# Patient Record
Sex: Male | Born: 1935
Health system: Southern US, Community
[De-identification: ages and names within clinical notes are randomized; demographics above are authoritative.]

## PROBLEM LIST (undated history)

## (undated) DIAGNOSIS — N179 Acute kidney failure, unspecified: Secondary | ICD-10-CM

## (undated) DIAGNOSIS — I10 Essential (primary) hypertension: Secondary | ICD-10-CM

## (undated) DIAGNOSIS — D62 Acute posthemorrhagic anemia: Secondary | ICD-10-CM

## (undated) DIAGNOSIS — C801 Malignant (primary) neoplasm, unspecified: Secondary | ICD-10-CM

## (undated) HISTORY — PX: KIDNEY STONE SURGERY: SHX686

## (undated) HISTORY — PX: EYE SURGERY: SHX253

## (undated) HISTORY — PX: CHOLECYSTECTOMY: SHX55

## (undated) HISTORY — PX: TONSILLECTOMY: SUR1361

---

## 2000-12-07 ENCOUNTER — Emergency Department (HOSPITAL_COMMUNITY): Admission: EM | Admit: 2000-12-07 | Discharge: 2000-12-07 | Payer: Self-pay | Admitting: Emergency Medicine

## 2001-02-25 ENCOUNTER — Other Ambulatory Visit: Admission: RE | Admit: 2001-02-25 | Discharge: 2001-02-25 | Payer: Self-pay | Admitting: Dermatology

## 2004-02-12 ENCOUNTER — Emergency Department (HOSPITAL_COMMUNITY): Admission: EM | Admit: 2004-02-12 | Discharge: 2004-02-12 | Payer: Self-pay | Admitting: Emergency Medicine

## 2004-03-03 ENCOUNTER — Ambulatory Visit (HOSPITAL_COMMUNITY): Admission: RE | Admit: 2004-03-03 | Discharge: 2004-03-03 | Payer: Self-pay | Admitting: Internal Medicine

## 2004-11-17 ENCOUNTER — Emergency Department (HOSPITAL_COMMUNITY): Admission: EM | Admit: 2004-11-17 | Discharge: 2004-11-17 | Payer: Self-pay | Admitting: Emergency Medicine

## 2004-12-14 ENCOUNTER — Inpatient Hospital Stay (HOSPITAL_COMMUNITY): Admission: RE | Admit: 2004-12-14 | Discharge: 2004-12-16 | Payer: Self-pay | Admitting: Urology

## 2005-01-18 ENCOUNTER — Inpatient Hospital Stay (HOSPITAL_COMMUNITY): Admission: RE | Admit: 2005-01-18 | Discharge: 2005-01-20 | Payer: Self-pay | Admitting: Urology

## 2005-05-17 ENCOUNTER — Encounter: Admission: RE | Admit: 2005-05-17 | Discharge: 2005-05-17 | Payer: Self-pay | Admitting: Urology

## 2005-05-19 ENCOUNTER — Ambulatory Visit (HOSPITAL_BASED_OUTPATIENT_CLINIC_OR_DEPARTMENT_OTHER): Admission: RE | Admit: 2005-05-19 | Discharge: 2005-05-19 | Payer: Self-pay | Admitting: Urology

## 2005-05-19 ENCOUNTER — Ambulatory Visit (HOSPITAL_COMMUNITY): Admission: RE | Admit: 2005-05-19 | Discharge: 2005-05-19 | Payer: Self-pay | Admitting: Urology

## 2005-08-31 ENCOUNTER — Ambulatory Visit (HOSPITAL_BASED_OUTPATIENT_CLINIC_OR_DEPARTMENT_OTHER): Admission: RE | Admit: 2005-08-31 | Discharge: 2005-08-31 | Payer: Self-pay | Admitting: Urology

## 2006-06-06 ENCOUNTER — Ambulatory Visit: Payer: Self-pay | Admitting: Internal Medicine

## 2006-06-06 ENCOUNTER — Ambulatory Visit (HOSPITAL_COMMUNITY): Admission: RE | Admit: 2006-06-06 | Discharge: 2006-06-06 | Payer: Self-pay | Admitting: Internal Medicine

## 2011-07-21 DIAGNOSIS — I10 Essential (primary) hypertension: Secondary | ICD-10-CM | POA: Diagnosis not present

## 2011-07-21 DIAGNOSIS — Z79899 Other long term (current) drug therapy: Secondary | ICD-10-CM | POA: Diagnosis not present

## 2011-07-28 DIAGNOSIS — N390 Urinary tract infection, site not specified: Secondary | ICD-10-CM | POA: Diagnosis not present

## 2011-07-28 DIAGNOSIS — I10 Essential (primary) hypertension: Secondary | ICD-10-CM | POA: Diagnosis not present

## 2011-10-06 DIAGNOSIS — N390 Urinary tract infection, site not specified: Secondary | ICD-10-CM | POA: Diagnosis not present

## 2012-03-22 DIAGNOSIS — Z79899 Other long term (current) drug therapy: Secondary | ICD-10-CM | POA: Diagnosis not present

## 2012-03-28 DIAGNOSIS — K5289 Other specified noninfective gastroenteritis and colitis: Secondary | ICD-10-CM | POA: Diagnosis not present

## 2012-03-28 DIAGNOSIS — I10 Essential (primary) hypertension: Secondary | ICD-10-CM | POA: Diagnosis not present

## 2012-07-18 DIAGNOSIS — H6120 Impacted cerumen, unspecified ear: Secondary | ICD-10-CM | POA: Diagnosis not present

## 2012-07-18 DIAGNOSIS — H70009 Acute mastoiditis without complications, unspecified ear: Secondary | ICD-10-CM | POA: Diagnosis not present

## 2012-07-25 DIAGNOSIS — H6120 Impacted cerumen, unspecified ear: Secondary | ICD-10-CM | POA: Diagnosis not present

## 2012-10-03 DIAGNOSIS — N390 Urinary tract infection, site not specified: Secondary | ICD-10-CM | POA: Diagnosis not present

## 2012-10-03 DIAGNOSIS — C679 Malignant neoplasm of bladder, unspecified: Secondary | ICD-10-CM | POA: Diagnosis not present

## 2012-10-03 DIAGNOSIS — Z792 Long term (current) use of antibiotics: Secondary | ICD-10-CM | POA: Diagnosis not present

## 2013-09-29 DIAGNOSIS — H6120 Impacted cerumen, unspecified ear: Secondary | ICD-10-CM | POA: Diagnosis not present

## 2013-11-28 DIAGNOSIS — R82998 Other abnormal findings in urine: Secondary | ICD-10-CM | POA: Diagnosis not present

## 2013-11-28 DIAGNOSIS — N390 Urinary tract infection, site not specified: Secondary | ICD-10-CM | POA: Diagnosis not present

## 2013-11-28 DIAGNOSIS — B958 Unspecified staphylococcus as the cause of diseases classified elsewhere: Secondary | ICD-10-CM | POA: Diagnosis not present

## 2013-11-28 DIAGNOSIS — C672 Malignant neoplasm of lateral wall of bladder: Secondary | ICD-10-CM | POA: Diagnosis not present

## 2013-12-25 DIAGNOSIS — H5200 Hypermetropia, unspecified eye: Secondary | ICD-10-CM | POA: Diagnosis not present

## 2013-12-25 DIAGNOSIS — H2589 Other age-related cataract: Secondary | ICD-10-CM | POA: Diagnosis not present

## 2013-12-25 DIAGNOSIS — Z961 Presence of intraocular lens: Secondary | ICD-10-CM | POA: Diagnosis not present

## 2013-12-25 DIAGNOSIS — H26499 Other secondary cataract, unspecified eye: Secondary | ICD-10-CM | POA: Diagnosis not present

## 2014-03-16 DIAGNOSIS — H70009 Acute mastoiditis without complications, unspecified ear: Secondary | ICD-10-CM | POA: Diagnosis not present

## 2014-03-16 DIAGNOSIS — H6120 Impacted cerumen, unspecified ear: Secondary | ICD-10-CM | POA: Diagnosis not present

## 2014-03-16 DIAGNOSIS — H60399 Other infective otitis externa, unspecified ear: Secondary | ICD-10-CM | POA: Diagnosis not present

## 2014-04-06 DIAGNOSIS — I10 Essential (primary) hypertension: Secondary | ICD-10-CM | POA: Diagnosis not present

## 2014-04-06 DIAGNOSIS — L03221 Cellulitis of neck: Secondary | ICD-10-CM | POA: Diagnosis not present

## 2014-07-16 DIAGNOSIS — B354 Tinea corporis: Secondary | ICD-10-CM | POA: Diagnosis not present

## 2014-10-15 DIAGNOSIS — L309 Dermatitis, unspecified: Secondary | ICD-10-CM | POA: Diagnosis not present

## 2014-12-03 DIAGNOSIS — R319 Hematuria, unspecified: Secondary | ICD-10-CM | POA: Diagnosis not present

## 2014-12-03 DIAGNOSIS — C679 Malignant neoplasm of bladder, unspecified: Secondary | ICD-10-CM | POA: Diagnosis not present

## 2015-04-13 ENCOUNTER — Emergency Department (HOSPITAL_COMMUNITY): Payer: Medicare Other

## 2015-04-13 ENCOUNTER — Emergency Department (HOSPITAL_COMMUNITY)
Admission: EM | Admit: 2015-04-13 | Discharge: 2015-04-13 | Disposition: A | Discharge status: Home / Self Care | Payer: Medicare Other | Attending: Physician Assistant | Admitting: Physician Assistant

## 2015-04-13 ENCOUNTER — Encounter (HOSPITAL_COMMUNITY): Payer: Self-pay | Admitting: Emergency Medicine

## 2015-04-13 DIAGNOSIS — S72114A Nondisplaced fracture of greater trochanter of right femur, initial encounter for closed fracture: Secondary | ICD-10-CM | POA: Diagnosis not present

## 2015-04-13 DIAGNOSIS — W19XXXA Unspecified fall, initial encounter: Secondary | ICD-10-CM

## 2015-04-13 DIAGNOSIS — Z720 Tobacco use: Secondary | ICD-10-CM | POA: Diagnosis not present

## 2015-04-13 DIAGNOSIS — Y998 Other external cause status: Secondary | ICD-10-CM | POA: Diagnosis not present

## 2015-04-13 DIAGNOSIS — Y9389 Activity, other specified: Secondary | ICD-10-CM | POA: Diagnosis not present

## 2015-04-13 DIAGNOSIS — I10 Essential (primary) hypertension: Secondary | ICD-10-CM | POA: Insufficient documentation

## 2015-04-13 DIAGNOSIS — W1839XA Other fall on same level, initial encounter: Secondary | ICD-10-CM | POA: Diagnosis not present

## 2015-04-13 DIAGNOSIS — Y92009 Unspecified place in unspecified non-institutional (private) residence as the place of occurrence of the external cause: Secondary | ICD-10-CM | POA: Insufficient documentation

## 2015-04-13 DIAGNOSIS — S79911A Unspecified injury of right hip, initial encounter: Secondary | ICD-10-CM | POA: Diagnosis present

## 2015-04-13 DIAGNOSIS — Z967 Presence of other bone and tendon implants: Secondary | ICD-10-CM | POA: Diagnosis not present

## 2015-04-13 DIAGNOSIS — M25551 Pain in right hip: Secondary | ICD-10-CM

## 2015-04-13 DIAGNOSIS — R609 Edema, unspecified: Secondary | ICD-10-CM | POA: Diagnosis not present

## 2015-04-13 DIAGNOSIS — Z9889 Other specified postprocedural states: Secondary | ICD-10-CM | POA: Diagnosis not present

## 2015-04-13 HISTORY — DX: Essential (primary) hypertension: I10

## 2015-04-13 MED ORDER — OXYCODONE-ACETAMINOPHEN 5-325 MG PO TABS: 1.0000 | ORAL_TABLET | Freq: Four times a day (QID) | ORAL | Status: DC | PRN

## 2015-04-13 MED ORDER — OXYCODONE-ACETAMINOPHEN 5-325 MG PO TABS
1.0000 | ORAL_TABLET | Freq: Once | ORAL | Status: AC
  Administered 2015-04-13: 1 via ORAL
  Filled 2015-04-13: qty 1

## 2015-04-13 NOTE — ED Notes (Signed)
Patient given discharge instruction, verbalized understand. Assisted pt to dress and in wheelchair out of the department, with neighbors, pt advise on med alert, has walker at home

## 2015-04-13 NOTE — ED Notes (Signed)
MD at bedside. 

## 2015-04-13 NOTE — ED Notes (Signed)
Pt c/o sharp pain to right hip/buttocks area after fall today at home. Pt reports he was able to hobble about 50 feet to get help but it caused severe pain in his right hip/buttocks area. Pt denies pain lying flat in bed but upon ambulation pain is 9/10.

## 2015-04-13 NOTE — ED Notes (Signed)
Fell at home after turning and hit right hip.  Rates pain 9/10 when standing.  Denies hitting head.

## 2015-04-13 NOTE — ED Provider Notes (Signed)
CSN: [id number]     Arrival date & time 04/13/15  1507 History   First MD Initiated Contact with Patient 04/13/15 1534     Chief Complaint  Patient presents with  . Fall     (Consider location/radiation/quality/duration/timing/severity/associated sxs/prior Treatment) HPI   Patient is a 81 year old male presenting after fall onto his right hip. Patient has history of hypertension and takes 2 medications. Patient was pivoting to the right and fell onto his right side. He denies any striking of his head shoulder or side. He fell directly onto his right hip. This happened at 11:30 this morning he took 3 Tylenol waited 2-3 hours but was still unable to bear weight and so called EMS  Past Medical History  Diagnosis Date  . Hypertension    Past Surgical History  Procedure Laterality Date  . Tonsillectomy    . Cholecystectomy    . Kidney stone surgery     No family history on file. Social History  Substance Use Topics  . Smoking status: Current Every Day Smoker    Types: Cigarettes  . Smokeless tobacco: None  . Alcohol Use: No    Review of Systems  Constitutional: Negative for fever, activity change and fatigue.  Respiratory: Negative for shortness of breath.   Cardiovascular: Negative for chest pain.  Gastrointestinal: Negative for abdominal pain.  Musculoskeletal: Positive for myalgias and gait problem.  Skin: Negative for rash.  Neurological: Negative for dizziness, facial asymmetry and weakness.  Psychiatric/Behavioral: Negative for behavioral problems and agitation.      Allergies  Review of patient's allergies indicates no known allergies.  Home Medications   Prior to Admission medications   Not on File   BP 167/57 mmHg  Pulse 68  Temp(Src) 97.7 F (36.5 C) (Oral)  Resp 20  Ht 6' (1.829 m)  Wt 120 lb (54.432 kg)  BMI 16.27 kg/m2  SpO2 95% Physical Exam  Constitutional: He is oriented to person, place, and time. He appears well-nourished.  HENT:  Head:  Normocephalic.  Mouth/Throat: Oropharynx is clear and moist.  Eyes: Conjunctivae are normal.  Neck: No tracheal deviation present.  Cardiovascular: Normal rate.   Pulmonary/Chest: Effort normal. No stridor. No respiratory distress.  Abdominal: Soft. There is no tenderness. There is no guarding.  Musculoskeletal: He exhibits no edema.  Patient has normal range of motion of the right hip. Mild tenderness with internal rotation. No tenderness to external palpation of the hip or pelvic bones. Patient points to more posterior area for pain. Normal sensation and circulation  Neurological: He is oriented to person, place, and time. No cranial nerve deficit.  Skin: Skin is warm and dry. No rash noted. He is not diaphoretic.  No external signs of trauma  Psychiatric: He has a normal mood and affect. His behavior is normal.  Nursing note and vitals reviewed.   ED Course  Procedures (including critical care time) Labs Review Labs Reviewed - No data to display  Imaging Review No results found. I have personally reviewed and evaluated these images and lab results as part of my medical decision-making.   EKG Interpretation None      MDM   Final diagnoses:  Fall    Patient is an adorable 81 year old male presenting after falling multiple hours ago. Patient reports that he fell onto his right side and has had trouble bearing weight on his right leg. Did not hit head. No external signs of trauma. Patient has no pain at rest. Mild pain with internal rotation  of the hip. Patient is healthy otherwise. Concerned today for a hip fracture. We'll get x-rays. If negative low threshold to consider CT scan for occult fracture.  9:08 PM MRI shows occult greater trochanter fracture. Discussed with orthopedist. Dr. Marlou Sa thinks she should partially weight-bear. We will offer a walker and pain control. Follow-up in one week.  Midge Momon Julio Alm, MD 04/13/15 2110

## 2015-04-13 NOTE — Discharge Instructions (Signed)
You were seen today after a fall. You have a small fracture in your greater trochanter. We will need to use a walker and pain control. You will need to follow up with Dr. Marlou Sa from orthopedics in 1 week.

## 2015-04-13 NOTE — ED Notes (Signed)
Pt not sure if he is on blood thinners or if he is allergic to medications.

## 2015-04-22 ENCOUNTER — Inpatient Hospital Stay (HOSPITAL_COMMUNITY): Payer: Medicare Other

## 2015-04-22 ENCOUNTER — Encounter (HOSPITAL_COMMUNITY): Payer: Self-pay

## 2015-04-22 ENCOUNTER — Inpatient Hospital Stay (HOSPITAL_COMMUNITY)
Admission: AD | Admit: 2015-04-22 | Discharge: 2015-04-26 | DRG: 481 | Disposition: A | Discharge status: Skilled Nursing Facility | Payer: Medicare Other | Source: Ambulatory Visit | Attending: Internal Medicine | Admitting: Internal Medicine

## 2015-04-22 ENCOUNTER — Encounter (HOSPITAL_COMMUNITY)
Admission: AD | Disposition: A | Discharge status: Skilled Nursing Facility | Payer: Self-pay | Source: Ambulatory Visit | Attending: Internal Medicine

## 2015-04-22 ENCOUNTER — Inpatient Hospital Stay (HOSPITAL_COMMUNITY): Payer: Medicare Other | Admitting: Certified Registered"

## 2015-04-22 DIAGNOSIS — Z79899 Other long term (current) drug therapy: Secondary | ICD-10-CM | POA: Diagnosis not present

## 2015-04-22 DIAGNOSIS — N179 Acute kidney failure, unspecified: Secondary | ICD-10-CM | POA: Diagnosis not present

## 2015-04-22 DIAGNOSIS — M21251 Flexion deformity, right hip: Secondary | ICD-10-CM | POA: Diagnosis not present

## 2015-04-22 DIAGNOSIS — S72114A Nondisplaced fracture of greater trochanter of right femur, initial encounter for closed fracture: Principal | ICD-10-CM | POA: Diagnosis present

## 2015-04-22 DIAGNOSIS — S72141A Displaced intertrochanteric fracture of right femur, initial encounter for closed fracture: Secondary | ICD-10-CM | POA: Diagnosis present

## 2015-04-22 DIAGNOSIS — W1830XA Fall on same level, unspecified, initial encounter: Secondary | ICD-10-CM | POA: Diagnosis present

## 2015-04-22 DIAGNOSIS — E440 Moderate protein-calorie malnutrition: Secondary | ICD-10-CM | POA: Diagnosis present

## 2015-04-22 DIAGNOSIS — Z8551 Personal history of malignant neoplasm of bladder: Secondary | ICD-10-CM | POA: Diagnosis not present

## 2015-04-22 DIAGNOSIS — S72141D Displaced intertrochanteric fracture of right femur, subsequent encounter for closed fracture with routine healing: Secondary | ICD-10-CM | POA: Diagnosis not present

## 2015-04-22 DIAGNOSIS — Z4789 Encounter for other orthopedic aftercare: Secondary | ICD-10-CM | POA: Diagnosis not present

## 2015-04-22 DIAGNOSIS — S72001A Fracture of unspecified part of neck of right femur, initial encounter for closed fracture: Secondary | ICD-10-CM

## 2015-04-22 DIAGNOSIS — Z9181 History of falling: Secondary | ICD-10-CM | POA: Diagnosis not present

## 2015-04-22 DIAGNOSIS — S72009A Fracture of unspecified part of neck of unspecified femur, initial encounter for closed fracture: Secondary | ICD-10-CM | POA: Diagnosis present

## 2015-04-22 DIAGNOSIS — Z681 Body mass index (BMI) 19 or less, adult: Secondary | ICD-10-CM

## 2015-04-22 DIAGNOSIS — M25551 Pain in right hip: Secondary | ICD-10-CM | POA: Diagnosis present

## 2015-04-22 DIAGNOSIS — R278 Other lack of coordination: Secondary | ICD-10-CM | POA: Diagnosis not present

## 2015-04-22 DIAGNOSIS — N390 Urinary tract infection, site not specified: Secondary | ICD-10-CM | POA: Diagnosis not present

## 2015-04-22 DIAGNOSIS — I10 Essential (primary) hypertension: Secondary | ICD-10-CM | POA: Diagnosis present

## 2015-04-22 DIAGNOSIS — Z9981 Dependence on supplemental oxygen: Secondary | ICD-10-CM | POA: Diagnosis not present

## 2015-04-22 DIAGNOSIS — Z01818 Encounter for other preprocedural examination: Secondary | ICD-10-CM

## 2015-04-22 DIAGNOSIS — R262 Difficulty in walking, not elsewhere classified: Secondary | ICD-10-CM | POA: Diagnosis not present

## 2015-04-22 DIAGNOSIS — Z9889 Other specified postprocedural states: Secondary | ICD-10-CM | POA: Diagnosis not present

## 2015-04-22 DIAGNOSIS — N3000 Acute cystitis without hematuria: Secondary | ICD-10-CM | POA: Diagnosis not present

## 2015-04-22 DIAGNOSIS — Z87891 Personal history of nicotine dependence: Secondary | ICD-10-CM

## 2015-04-22 DIAGNOSIS — M6281 Muscle weakness (generalized): Secondary | ICD-10-CM | POA: Diagnosis not present

## 2015-04-22 DIAGNOSIS — D62 Acute posthemorrhagic anemia: Secondary | ICD-10-CM | POA: Diagnosis not present

## 2015-04-22 DIAGNOSIS — S72051A Unspecified fracture of head of right femur, initial encounter for closed fracture: Secondary | ICD-10-CM | POA: Diagnosis not present

## 2015-04-22 HISTORY — DX: Malignant (primary) neoplasm, unspecified: C80.1

## 2015-04-22 HISTORY — DX: Acute kidney failure, unspecified: N17.9

## 2015-04-22 HISTORY — PX: INTRAMEDULLARY (IM) NAIL INTERTROCHANTERIC: SHX5875

## 2015-04-22 HISTORY — DX: Acute posthemorrhagic anemia: D62

## 2015-04-22 LAB — CBC
HCT: 33 % — ABNORMAL LOW (ref 39.0–52.0)
Hemoglobin: 10.5 g/dL — ABNORMAL LOW (ref 13.0–17.0)
MCH: 31 pg (ref 26.0–34.0)
MCHC: 31.8 g/dL (ref 30.0–36.0)
MCV: 97.3 fL (ref 78.0–100.0)
PLATELETS: 274 10*3/uL (ref 150–400)
RBC: 3.39 MIL/uL — ABNORMAL LOW (ref 4.22–5.81)
RDW: 14.1 % (ref 11.5–15.5)
WBC: 9.7 10*3/uL (ref 4.0–10.5)

## 2015-04-22 LAB — SURGICAL PCR SCREEN
MRSA, PCR: NEGATIVE
Staphylococcus aureus: NEGATIVE

## 2015-04-22 LAB — COMPREHENSIVE METABOLIC PANEL
ALBUMIN: 3.3 g/dL — AB (ref 3.5–5.0)
ALK PHOS: 135 U/L — AB (ref 38–126)
ALT: 42 U/L (ref 17–63)
ANION GAP: 10 (ref 5–15)
AST: 35 U/L (ref 15–41)
BILIRUBIN TOTAL: 0.5 mg/dL (ref 0.3–1.2)
BUN: 30 mg/dL — AB (ref 6–20)
CALCIUM: 9.2 mg/dL (ref 8.9–10.3)
CO2: 24 mmol/L (ref 22–32)
Chloride: 106 mmol/L (ref 101–111)
Creatinine, Ser: 1.43 mg/dL — ABNORMAL HIGH (ref 0.61–1.24)
GFR calc Af Amer: 48 mL/min — ABNORMAL LOW (ref >60)
GFR calc non Af Amer: 42 mL/min — ABNORMAL LOW (ref >60)
GLUCOSE: 91 mg/dL (ref 65–99)
Potassium: 4.3 mmol/L (ref 3.5–5.1)
Sodium: 140 mmol/L (ref 135–145)
Total Protein: 6.4 g/dL — ABNORMAL LOW (ref 6.5–8.1)

## 2015-04-22 LAB — URINALYSIS, ROUTINE W REFLEX MICROSCOPIC
BILIRUBIN URINE: NEGATIVE
Glucose, UA: NEGATIVE mg/dL
Hgb urine dipstick: NEGATIVE
Ketones, ur: NEGATIVE mg/dL
NITRITE: NEGATIVE
PH: 6 (ref 5.0–8.0)
Protein, ur: NEGATIVE mg/dL
SPECIFIC GRAVITY, URINE: 1.018 (ref 1.005–1.030)
Urobilinogen, UA: 1 mg/dL (ref 0.0–1.0)

## 2015-04-22 LAB — URINE MICROSCOPIC-ADD ON

## 2015-04-22 LAB — PROTIME-INR
INR: 1.12 (ref 0.00–1.49)
PROTHROMBIN TIME: 14.6 s (ref 11.6–15.2)

## 2015-04-22 SURGERY — FIXATION, FRACTURE, INTERTROCHANTERIC, WITH INTRAMEDULLARY ROD
Anesthesia: General | Laterality: Right

## 2015-04-22 SURGERY — FIXATION, FRACTURE, INTERTROCHANTERIC, WITH INTRAMEDULLARY ROD
Anesthesia: General | Site: Hip | Laterality: Right

## 2015-04-22 MED ORDER — PROPOFOL 10 MG/ML IV BOLUS
INTRAVENOUS | Status: AC
  Filled 2015-04-22: qty 20

## 2015-04-22 MED ORDER — 0.9 % SODIUM CHLORIDE (POUR BTL) OPTIME
TOPICAL | Status: DC | PRN
  Administered 2015-04-22: 1000 mL

## 2015-04-22 MED ORDER — METOCLOPRAMIDE HCL 5 MG/ML IJ SOLN: 5.0000 mg | Freq: Three times a day (TID) | INTRAMUSCULAR | Status: DC | PRN

## 2015-04-22 MED ORDER — FENTANYL CITRATE (PF) 100 MCG/2ML IJ SOLN
25.0000 ug | INTRAMUSCULAR | Status: DC | PRN
  Administered 2015-04-22 (×2): 50 ug via INTRAVENOUS
  Administered 2015-04-23: 25 ug via INTRAVENOUS
  Administered 2015-04-23: 50 ug via INTRAVENOUS
  Filled 2015-04-22 (×2): qty 2

## 2015-04-22 MED ORDER — AMLODIPINE BESYLATE 5 MG PO TABS
5.0000 mg | ORAL_TABLET | Freq: Every day | ORAL | Status: DC
  Administered 2015-04-23 – 2015-04-25 (×3): 5 mg via ORAL
  Filled 2015-04-22 (×3): qty 1

## 2015-04-22 MED ORDER — FENTANYL CITRATE (PF) 100 MCG/2ML IJ SOLN
INTRAMUSCULAR | Status: DC | PRN
  Administered 2015-04-22 (×2): 50 ug via INTRAVENOUS
  Administered 2015-04-22: 100 ug via INTRAVENOUS

## 2015-04-22 MED ORDER — MORPHINE SULFATE (PF) 2 MG/ML IV SOLN: 0.5000 mg | INTRAVENOUS | Status: DC | PRN

## 2015-04-22 MED ORDER — PHENOL 1.4 % MT LIQD: 1.0000 | OROMUCOSAL | Status: DC | PRN

## 2015-04-22 MED ORDER — HYDROCODONE-ACETAMINOPHEN 5-325 MG PO TABS
1.0000 | ORAL_TABLET | Freq: Four times a day (QID) | ORAL | Status: DC | PRN
  Administered 2015-04-22: 1 via ORAL
  Administered 2015-04-23 – 2015-04-24 (×2): 2 via ORAL
  Filled 2015-04-22 (×3): qty 2
  Filled 2015-04-22: qty 1

## 2015-04-22 MED ORDER — METHOCARBAMOL 500 MG PO TABS
500.0000 mg | ORAL_TABLET | Freq: Four times a day (QID) | ORAL | Status: DC | PRN
  Administered 2015-04-23: 500 mg via ORAL
  Filled 2015-04-22: qty 1

## 2015-04-22 MED ORDER — GLYCOPYRROLATE 0.2 MG/ML IJ SOLN
INTRAMUSCULAR | Status: AC
  Filled 2015-04-22: qty 1

## 2015-04-22 MED ORDER — FENTANYL CITRATE (PF) 250 MCG/5ML IJ SOLN
INTRAMUSCULAR | Status: AC
  Filled 2015-04-22: qty 5

## 2015-04-22 MED ORDER — ONDANSETRON HCL 4 MG/2ML IJ SOLN: 4.0000 mg | Freq: Four times a day (QID) | INTRAMUSCULAR | Status: DC | PRN

## 2015-04-22 MED ORDER — ROCURONIUM BROMIDE 100 MG/10ML IV SOLN
INTRAVENOUS | Status: DC | PRN
  Administered 2015-04-22: 20 mg via INTRAVENOUS
  Administered 2015-04-22: 30 mg via INTRAVENOUS

## 2015-04-22 MED ORDER — SODIUM CHLORIDE 0.9 % IJ SOLN
INTRAMUSCULAR | Status: AC
  Filled 2015-04-22: qty 10

## 2015-04-22 MED ORDER — SODIUM CHLORIDE 0.9 % IV SOLN
INTRAVENOUS | Status: DC
  Administered 2015-04-22 – 2015-04-24 (×4): via INTRAVENOUS

## 2015-04-22 MED ORDER — GLYCOPYRROLATE 0.2 MG/ML IJ SOLN
INTRAMUSCULAR | Status: DC | PRN
  Administered 2015-04-22: 0.6 mg via INTRAVENOUS

## 2015-04-22 MED ORDER — EPHEDRINE SULFATE 50 MG/ML IJ SOLN
INTRAMUSCULAR | Status: AC
  Filled 2015-04-22: qty 1

## 2015-04-22 MED ORDER — METHOCARBAMOL 1000 MG/10ML IJ SOLN
500.0000 mg | Freq: Four times a day (QID) | INTRAVENOUS | Status: DC | PRN
  Administered 2015-04-22: 500 mg via INTRAVENOUS
  Filled 2015-04-22: qty 5

## 2015-04-22 MED ORDER — ONDANSETRON HCL 4 MG PO TABS: 4.0000 mg | ORAL_TABLET | Freq: Four times a day (QID) | ORAL | Status: DC | PRN

## 2015-04-22 MED ORDER — METOCLOPRAMIDE HCL 5 MG PO TABS: 5.0000 mg | ORAL_TABLET | Freq: Three times a day (TID) | ORAL | Status: DC | PRN

## 2015-04-22 MED ORDER — ACETAMINOPHEN 650 MG RE SUPP: 650.0000 mg | Freq: Four times a day (QID) | RECTAL | Status: DC | PRN

## 2015-04-22 MED ORDER — ONDANSETRON HCL 4 MG/2ML IJ SOLN
INTRAMUSCULAR | Status: DC | PRN
  Administered 2015-04-22: 4 mg via INTRAVENOUS

## 2015-04-22 MED ORDER — NEOSTIGMINE METHYLSULFATE 10 MG/10ML IV SOLN
INTRAVENOUS | Status: AC
  Filled 2015-04-22: qty 1

## 2015-04-22 MED ORDER — FENTANYL CITRATE (PF) 100 MCG/2ML IJ SOLN
INTRAMUSCULAR | Status: AC
  Filled 2015-04-22: qty 2

## 2015-04-22 MED ORDER — LIDOCAINE HCL (CARDIAC) 20 MG/ML IV SOLN
INTRAVENOUS | Status: AC
  Filled 2015-04-22: qty 5

## 2015-04-22 MED ORDER — ROCURONIUM BROMIDE 50 MG/5ML IV SOLN
INTRAVENOUS | Status: AC
  Filled 2015-04-22: qty 1

## 2015-04-22 MED ORDER — SUCCINYLCHOLINE CHLORIDE 20 MG/ML IJ SOLN
INTRAMUSCULAR | Status: AC
  Filled 2015-04-22: qty 1

## 2015-04-22 MED ORDER — PROPOFOL 10 MG/ML IV BOLUS
INTRAVENOUS | Status: DC | PRN
Start: 2015-04-22 — End: 2015-04-22
  Administered 2015-04-22: 120 mg via INTRAVENOUS

## 2015-04-22 MED ORDER — ASPIRIN EC 325 MG PO TBEC
325.0000 mg | DELAYED_RELEASE_TABLET | Freq: Every day | ORAL | Status: DC
  Administered 2015-04-23 – 2015-04-26 (×4): 325 mg via ORAL
  Filled 2015-04-22 (×4): qty 1

## 2015-04-22 MED ORDER — CEFAZOLIN SODIUM-DEXTROSE 2-3 GM-% IV SOLR
2.0000 g | Freq: Four times a day (QID) | INTRAVENOUS | Status: AC
  Administered 2015-04-23 (×2): 2 g via INTRAVENOUS
  Filled 2015-04-22 (×2): qty 50

## 2015-04-22 MED ORDER — LIDOCAINE HCL (CARDIAC) 20 MG/ML IV SOLN
INTRAVENOUS | Status: DC | PRN
  Administered 2015-04-22: 50 mg via INTRAVENOUS

## 2015-04-22 MED ORDER — LACTATED RINGERS IV SOLN
INTRAVENOUS | Status: DC | PRN
  Administered 2015-04-22 (×2): via INTRAVENOUS

## 2015-04-22 MED ORDER — NEOSTIGMINE METHYLSULFATE 10 MG/10ML IV SOLN
INTRAVENOUS | Status: DC | PRN
Start: 2015-04-22 — End: 2015-04-22
  Administered 2015-04-22: 4 mg via INTRAVENOUS

## 2015-04-22 MED ORDER — CEFAZOLIN SODIUM-DEXTROSE 2-3 GM-% IV SOLR
2.0000 g | Freq: Once | INTRAVENOUS | Status: AC
  Administered 2015-04-22: 2 g via INTRAVENOUS
  Filled 2015-04-22 (×2): qty 50

## 2015-04-22 MED ORDER — SUCCINYLCHOLINE CHLORIDE 20 MG/ML IJ SOLN
INTRAMUSCULAR | Status: DC | PRN
  Administered 2015-04-22: 100 mg via INTRAVENOUS

## 2015-04-22 MED ORDER — MENTHOL 3 MG MT LOZG: 1.0000 | LOZENGE | OROMUCOSAL | Status: DC | PRN

## 2015-04-22 MED ORDER — ONDANSETRON HCL 4 MG/2ML IJ SOLN
INTRAMUSCULAR | Status: AC
  Filled 2015-04-22: qty 2

## 2015-04-22 MED ORDER — ACETAMINOPHEN 325 MG PO TABS
650.0000 mg | ORAL_TABLET | Freq: Four times a day (QID) | ORAL | Status: DC | PRN
  Administered 2015-04-24: 650 mg via ORAL
  Filled 2015-04-22: qty 2

## 2015-04-22 MED ORDER — LISINOPRIL 10 MG PO TABS: 10.0000 mg | ORAL_TABLET | Freq: Every day | ORAL | Status: DC

## 2015-04-22 MED ORDER — EPHEDRINE SULFATE 50 MG/ML IJ SOLN
INTRAMUSCULAR | Status: DC | PRN
  Administered 2015-04-22 (×2): 5 mg via INTRAVENOUS

## 2015-04-22 SURGICAL SUPPLY — 46 items
BLADE SURG 15 STRL LF DISP TIS (BLADE) ×1 IMPLANT
BLADE SURG 15 STRL SS (BLADE) ×3
BLADE SURG ROTATE 9660 (MISCELLANEOUS) IMPLANT
COVER PERINEAL POST (MISCELLANEOUS) ×3 IMPLANT
COVER SURGICAL LIGHT HANDLE (MISCELLANEOUS) ×3 IMPLANT
DRAPE ORTHO SPLIT 77X108 STRL (DRAPES) ×6
DRAPE PROXIMA HALF (DRAPES) ×4 IMPLANT
DRAPE STERI IOBAN 125X83 (DRAPES) ×3 IMPLANT
DRAPE SURG ORHT 6 SPLT 77X108 (DRAPES) IMPLANT
DRSG MEPILEX BORDER 4X4 (GAUZE/BANDAGES/DRESSINGS) ×4 IMPLANT
DRSG MEPILEX BORDER 4X8 (GAUZE/BANDAGES/DRESSINGS) ×1 IMPLANT
DURAPREP 26ML APPLICATOR (WOUND CARE) ×3 IMPLANT
ELECT REM PT RETURN 9FT ADLT (ELECTROSURGICAL) ×3
ELECTRODE REM PT RTRN 9FT ADLT (ELECTROSURGICAL) ×1 IMPLANT
FACESHIELD WRAPAROUND (MASK) IMPLANT
FACESHIELD WRAPAROUND OR TEAM (MASK) ×1 IMPLANT
GAUZE XEROFORM 5X9 LF (GAUZE/BANDAGES/DRESSINGS) ×3 IMPLANT
GLOVE BIOGEL PI IND STRL 8 (GLOVE) ×1 IMPLANT
GLOVE BIOGEL PI INDICATOR 8 (GLOVE) ×2
GLOVE SURG ORTHO 8.0 STRL STRW (GLOVE) ×3 IMPLANT
GOWN STRL REUS W/ TWL LRG LVL3 (GOWN DISPOSABLE) ×1 IMPLANT
GOWN STRL REUS W/ TWL XL LVL3 (GOWN DISPOSABLE) IMPLANT
GOWN STRL REUS W/TWL LRG LVL3 (GOWN DISPOSABLE) ×6
GOWN STRL REUS W/TWL XL LVL3 (GOWN DISPOSABLE)
GUIDE PIN 3.2MM (MISCELLANEOUS) ×3
GUIDE PIN ORTH 343X3.2XBRAD (MISCELLANEOUS) IMPLANT
KIT BASIN OR (CUSTOM PROCEDURE TRAY) ×3 IMPLANT
KIT ROOM TURNOVER OR (KITS) ×3 IMPLANT
LINER BOOT UNIVERSAL DISP (MISCELLANEOUS) ×1 IMPLANT
MANIFOLD NEPTUNE II (INSTRUMENTS) ×3 IMPLANT
NAIL RIGHT 10X38MM (Nail) ×2 IMPLANT
NS IRRIG 1000ML POUR BTL (IV SOLUTION) ×3 IMPLANT
PACK GENERAL/GYN (CUSTOM PROCEDURE TRAY) ×3 IMPLANT
PAD ARMBOARD 7.5X6 YLW CONV (MISCELLANEOUS) ×6 IMPLANT
SCREW LAG COMPR KIT 105/100 (Screw) ×2 IMPLANT
SPONGE LAP 4X18 X RAY DECT (DISPOSABLE) IMPLANT
STAPLER VISISTAT 35W (STAPLE) ×3 IMPLANT
SUT ETHILON 2 0 FS 18 (SUTURE) IMPLANT
SUT ETHILON 3 0 PS 1 (SUTURE) ×4 IMPLANT
SUT VIC AB 0 CT1 27 (SUTURE) ×6
SUT VIC AB 0 CT1 27XBRD ANBCTR (SUTURE) IMPLANT
SUT VIC AB 2-0 CTB1 (SUTURE) ×5 IMPLANT
TAPE STRIPS DRAPE STRL (GAUZE/BANDAGES/DRESSINGS) IMPLANT
TOWEL OR 17X24 6PK STRL BLUE (TOWEL DISPOSABLE) ×3 IMPLANT
TOWEL OR 17X26 10 PK STRL BLUE (TOWEL DISPOSABLE) ×3 IMPLANT
WATER STERILE IRR 1000ML POUR (IV SOLUTION) ×3 IMPLANT

## 2015-04-22 NOTE — Anesthesia Procedure Notes (Signed)
Procedure Name: Intubation Date/Time: 04/22/2015 8:00 PM Performed by: Charlynn Salih S Pre-anesthesia Checklist: Patient identified, Timeout performed, Emergency Drugs available, Suction available and Patient being monitored Patient Re-evaluated:Patient Re-evaluated prior to inductionOxygen Delivery Method: Circle system utilized Preoxygenation: Pre-oxygenation with 100% oxygen Intubation Type: IV induction Ventilation: Mask ventilation without difficulty Laryngoscope Size: Mac and 4 Grade View: Grade I Tube type: Oral Tube size: 7.5 mm Number of attempts: 1 Airway Equipment and Method: Stylet Placement Confirmation: ETT inserted through vocal cords under direct vision,  positive ETCO2 and breath sounds checked- equal and bilateral Secured at: 21 cm Tube secured with: Tape Dental Injury: Teeth and Oropharynx as per pre-operative assessment

## 2015-04-22 NOTE — Anesthesia Preprocedure Evaluation (Signed)
Anesthesia Evaluation  Patient identified by MRN, date of birth, ID band Patient awake    Reviewed: Allergy & Precautions, NPO status , Patient's Chart, lab work & pertinent test results  Airway Mallampati: II  TM Distance: >3 FB Neck ROM: Full    Dental   Pulmonary former smoker,    breath sounds clear to auscultation       Cardiovascular hypertension,  Rhythm:Regular Rate:Normal     Neuro/Psych    GI/Hepatic negative GI ROS, Neg liver ROS,   Endo/Other  negative endocrine ROS  Renal/GU negative Renal ROS     Musculoskeletal   Abdominal   Peds  Hematology   Anesthesia Other Findings   Reproductive/Obstetrics                             Anesthesia Physical Anesthesia Plan  ASA: III  Anesthesia Plan: General   Post-op Pain Management:    Induction: Intravenous, Rapid sequence and Cricoid pressure planned  Airway Management Planned: Oral ETT  Additional Equipment:   Intra-op Plan:   Post-operative Plan: Possible Post-op intubation/ventilation  Informed Consent: I have reviewed the patients History and Physical, chart, labs and discussed the procedure including the risks, benefits and alternatives for the proposed anesthesia with the patient or authorized representative who has indicated his/her understanding and acceptance.   Dental advisory given  Plan Discussed with: CRNA and Anesthesiologist  Anesthesia Plan Comments:         Anesthesia Quick Evaluation

## 2015-04-22 NOTE — H&P (Addendum)
[facility] History and Physical  PINCHUS WECKWERTH WCB:[id number] DOB: Mar 21, 1923 DOA: 04/22/2015  Referring physician: [facility] PCP: Asencion Noble, MD   Chief Complaint: right hip fracture  HPI: Bruce Webster is a delightful 81 y.o. 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 with the chief complaint of right hip pain after a mechanical fall that occurred 8 days ago. He is a direct admit from Dr. Nicki Reaper orthopedic surgeon's office where he had his follow-up visit today. Dr. Nicki Reaper anticipate surgery this evening.  Information is obtained from the patient. He reports about 8 days ago he suffered a mechanical fall. He denies striking his head or dizziness or loss of consciousness. He states he "tripped over my own feet" while he was changing direction while ambulating. He states he fell directly onto his hip went to the emergency department and was discharged. Chart review indicates MRI showed occult greater trochanter fracture. EDD noted indicates case discussed with Dr. Marlou Sa with orthopedic surgery who recommended partial weightbearing and follow-up in one week. Today was his one-week follow-up appointment. According to family at the bedside MRI was reevaluated Dr. Nicki Reaper recommend surgery. Patient was directly admitted. Since his time in the ED 8 days ago he reports right hip pain with ambulation. He reports taking pain medicine prescribed with little help. He denies any pain at rest. He is able to sit without discomfort. Prior to this follows in his usual state of health. He denies any fever chills recent sick contacts. He denies a chest pain palpitations shortness of breath cough headache dizziness syncope or near-syncope. He denies any dysuria hematuria frequency or urgency. He does report a tendency towards loose stool for which he takes Imodium.  Upon admission patient's afebrile hemodynamically stable with a blood pressure the high end of normal he is not  hypoxic.   Review of Systems:  10 point review of systems complete and all systems are negative except as indicated in the history of present illness  Past Medical History  Diagnosis Date  . Hypertension   . Cancer [facility]     Bladder   Past Surgical History  Procedure Laterality Date  . Tonsillectomy    . Cholecystectomy    . Kidney stone surgery    . Eye surgery      Cateract   Social History:  reports that he quit smoking about 2 months ago. His smoking use included Cigarettes and Cigars. He smoked 0.25 packs per day. He does not have any smokeless tobacco history on file. He reports that he does not drink alcohol or use illicit drugs. Patient lives alone he quit smoking 2 months ago he is very independent with ADLs. He continues to do so cleaning cooking and shopping. He continues to drive. 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 No Known Allergies  No family history on file. family medical history reviewed and noncontributory to the admission of this elderly gentleman  Prior to Admission medications   Medication Sig Start Date End Date Taking? Authorizing Provider  amLODipine (NORVASC) 5 MG tablet Take 5 mg by mouth daily. 04/05/15   Historical Provider, MD  cholecalciferol (VITAMIN D) 1000 UNITS tablet Take 1,000 Units by mouth daily.    Historical Provider, MD  lisinopril (PRINIVIL,ZESTRIL) 10 MG tablet Take 10 mg by mouth daily. 04/05/15   Historical Provider, MD  Multiple Vitamin (MULTIVITAMIN WITH MINERALS) TABS tablet Take 1 tablet by mouth daily.    Historical Provider, MD  oxyCODONE-acetaminophen (PERCOCET/ROXICET) 5-325  MG tablet Take 1 tablet by mouth every 6 (six) hours as needed for severe pain. 04/13/15   Courteney Lyn Mackuen, MD   Physical Exam: Filed Vitals:   04/22/15 1514  BP: 151/89  Pulse: 66  Temp: 97.1 F (36.2 C)  TempSrc: Oral  Resp: 18  SpO2: 100%    Wt Readings from Last 3 Encounters:  04/13/15 54.432 kg (120 lb)  04/13/15 54.432 kg (120 lb)    General:  Appears  calm and comfortable, somewhat thin Eyes: PERRL, normal lids, irises & conjunctiva ENT: grossly normal hearing, lips & tongue, very poor dentition Neck: no LAD, masses or thyromegaly Cardiovascular: RRR, no m/r/g. No LE edema.  Respiratory: CTA bilaterally, no w/r/r. Normal respiratory effort. Abdomen: soft, ntnd positive bowel sounds no guarding or rebounding Skin: no rash or induration seen on limited exam Musculoskeletal: grossly normal tone BUE/BLE Psychiatric: grossly normal mood and affect, speech fluent and appropriate Neurologic: grossly non-focal. speech clear facial symmetry           Labs on Admission:  Basic Metabolic Panel: No results for input(s): NA, K, CL, CO2, GLUCOSE, BUN, CREATININE, CALCIUM, MG, PHOS in the last 168 hours. Liver Function Tests: No results for input(s): AST, ALT, ALKPHOS, BILITOT, PROT, ALBUMIN in the last 168 hours. No results for input(s): LIPASE, AMYLASE in the last 168 hours. No results for input(s): AMMONIA in the last 168 hours. CBC: No results for input(s): WBC, NEUTROABS, HGB, HCT, MCV, PLT in the last 168 hours. Cardiac Enzymes: No results for input(s): CKTOTAL, CKMB, CKMBINDEX, TROPONINI in the last 168 hours.  BNP (last 3 results) No results for input(s): BNP in the last 8760 hours.  ProBNP (last 3 results) No results for input(s): PROBNP in the last 8760 hours.  CBG: No results for input(s): GLUCAP in the last 168 hours.  Radiological Exams on Admission: No results found.  EKG: Pending  Assessment/Plan Principal Problem:   Intertrochanteric fracture of right hip [facility]): Status post mechanical fall 8 days ago. MRI done at that time reveals marrow edema within the right greater trochanter with associated serpiginous linear signal abnormality most consistent with a nondisplaced fracture. Fracture cleft extends into the mid intertrochanteric aspect of the right hip, but does not extend through the lesser trochanter. Severe at  edema within the right gluteus medius consistent with high-grade partial tear. Right quadrant is fem wrist muscle strain. He was seen by orthopedics today as follow-up to his hospital visit after a fall. Imaging reevaluated and Dr. Nicki Reaper with orthopedics recommended direct admission for surgery this evening. Will admit to MedSurg. Will keep him nothing by mouth as he is on the add-on schedule for surgery. Will hold Lovenox and use SCDs for now. Will provide pain medicine as needed. Will obtain C met, PT/INR, CBC, urinalysis, EKG, chest x-ray. Will request case management social work consult as he will likely need short-term rehabilitation at discharge.  Active Problems:   HTN (hypertension): Home medications include Norvasc, lisinopril. Currently blood pressure high-end of normal. Reports he did take his medications today. Will hold these still postop then resume.  Right hip pain. Related #1. Supportive therapy as noted in #1.   Direct admit from Dr. Nicki Reaper with orthopedic surgery    Code Status: Full DVT Prophylaxis: Family Communication: close neighbors at bedside Disposition Plan: will likey need snf then home hopefully  Time spent: 24 minutes  [facility]    I have examined the patient, reviewed the chart and discussed the  plan of care with Dyanne Carrel, NP. I agree with the assessment and plan as outlined above. Will await EKG and lab work before making a final decision on on his risk for surgery.   Debbe Odea, MD   Addendum: Have reviewed EKG and lab results. He is a low risk for the propose procedure. Renal failure may be acute. No prior labs to compare with. Will start slow IVF at 50 cc/hr and hold Lisinopril. Recheck in AM.   Debbe Odea, MD

## 2015-04-22 NOTE — Transfer of Care (Signed)
Immediate Anesthesia Transfer of Care Note  Patient: Bruce Webster  Procedure(s) Performed: Procedure(s): INTRAMEDULLARY (IM) NAIL RIGHT HIP (Right)  Patient Location: PACU  Anesthesia Type:General  Level of Consciousness: awake, alert  and oriented  Airway & Oxygen Therapy: Patient Spontanous Breathing and Patient connected to face mask oxygen  Post-op Assessment: Report given to RN and Post -op Vital signs reviewed and stable  Post vital signs: Reviewed and stable  Last Vitals:  Filed Vitals:   04/22/15 1915  BP: 174/67  Pulse: 65  Temp: 36.6 C  Resp: 18    Complications: No apparent anesthesia complications

## 2015-04-22 NOTE — Brief Op Note (Signed)
04/22/2015  9:31 PM  PATIENT:  Bruce Webster  81 y.o. male  PRE-OPERATIVE DIAGNOSIS:  right hip fracture  POST-OPERATIVE DIAGNOSIS:  right hip fracture  PROCEDURE:  Procedure(s): INTRAMEDULLARY (IM) NAIL RIGHT HIP  SURGEON:  Surgeon(s): Meredith Pel, MD  ASSISTANT: none  ANESTHESIA:   general  EBL: 25 ml    Total I/O In: 1250 [I.V.:1250] Out: 100 [Blood:100]  BLOOD ADMINISTERED: none  DRAINS: none   LOCAL MEDICATIONS USED:  none  SPECIMEN:  No Specimen  COUNTS:  YES  TOURNIQUET:  * No tourniquets in log *  DICTATION: .Other Dictation: Dictation Number [id number]  PLAN OF CARE: Admit to inpatient   PATIENT DISPOSITION:  PACU - hemodynamically stable

## 2015-04-22 NOTE — H&P (Signed)
Bruce Webster is an 81 y.o. male.   Chief Complaint: Right hip pain HPI: Bruce Webster is a 81 year old patient who fell a week ago. Diagnosed initially with nondisplaced greater trochanter fracture. He lives alone and is had progressive difficulty with ambulating and taking touchdown weightbearing status. He was brought into clinic today by a neighbor. He has been unable to maintain weightbearing status and reports progressive and debilitating hip pain which is keeping him from being able to walk. He denies any other orthopedic complaints no family history or personal history of DVT or pulmonary embolism  Past Medical History  Diagnosis Date  . Hypertension   . Cancer [facility])     Bladder    Past Surgical History  Procedure Laterality Date  . Tonsillectomy    . Cholecystectomy    . Kidney stone surgery    . Eye surgery      Cateract    No family history on file. Social History:  reports that he quit smoking about 2 months ago. His smoking use included Cigarettes and Cigars. He smoked 0.25 packs per day. He does not have any smokeless tobacco history on file. He reports that he does not drink alcohol or use illicit drugs.  Allergies: No Known Allergies  Medications Prior to Admission  Medication Sig Dispense Refill  . amLODipine (NORVASC) 5 MG tablet Take 5 mg by mouth daily.    . cholecalciferol (VITAMIN D) 1000 UNITS tablet Take 1,000 Units by mouth daily.    [name] lisinopril (PRINIVIL,ZESTRIL) 10 MG tablet Take 10 mg by mouth daily.    . Multiple Vitamin (MULTIVITAMIN WITH MINERALS) TABS tablet Take 1 tablet by mouth daily.    [name] oxyCODONE-acetaminophen (PERCOCET/ROXICET) 5-325 MG tablet Take 1 tablet by mouth every 6 (six) hours as needed for severe pain. 15 tablet 0    Results for orders placed or performed during the hospital encounter of 04/22/15 (from the past 48 hour(s))  Urinalysis, Routine w reflex microscopic (not at [facility])     Status: Abnormal   Collection Time: 04/22/15  4:35 PM   Result Value Ref Range   Color, Urine YELLOW YELLOW   APPearance CLOUDY (A) CLEAR   Specific Gravity, Urine 1.018 1.005 - 1.030   pH 6.0 5.0 - 8.0   Glucose, UA NEGATIVE NEGATIVE mg/dL   Hgb urine dipstick NEGATIVE NEGATIVE   Bilirubin Urine NEGATIVE NEGATIVE   Ketones, ur NEGATIVE NEGATIVE mg/dL   Protein, ur NEGATIVE NEGATIVE mg/dL   Urobilinogen, UA 1.0 0.0 - 1.0 mg/dL   Nitrite NEGATIVE NEGATIVE   Leukocytes, UA LARGE (A) NEGATIVE  Urine microscopic-add on     Status: Abnormal   Collection Time: 04/22/15  4:35 PM  Result Value Ref Range   Squamous Epithelial / LPF RARE RARE   WBC, UA TOO NUMEROUS TO COUNT <3 WBC/hpf   RBC / HPF 0-2 <3 RBC/hpf   Bacteria, UA FEW (A) RARE  Protime-INR     Status: None   Collection Time: 04/22/15  4:55 PM  Result Value Ref Range   Prothrombin Time 14.6 11.6 - 15.2 seconds   INR 1.12 0.00 - 1.49  Comprehensive metabolic panel     Status: Abnormal   Collection Time: 04/22/15  4:55 PM  Result Value Ref Range   Sodium 140 135 - 145 mmol/L   Potassium 4.3 3.5 - 5.1 mmol/L   Chloride 106 101 - 111 mmol/L   CO2 24 22 - 32 mmol/L   Glucose, Bld 91 65 - 99  mg/dL   BUN 30 (H) 6 - 20 mg/dL   Creatinine, Ser 1.43 (H) 0.61 - 1.24 mg/dL   Calcium 9.2 8.9 - 10.3 mg/dL   Total Protein 6.4 (L) 6.5 - 8.1 g/dL   Albumin 3.3 (L) 3.5 - 5.0 g/dL   AST 35 15 - 41 U/L   ALT 42 17 - 63 U/L   Alkaline Phosphatase 135 (H) 38 - 126 U/L   Total Bilirubin 0.5 0.3 - 1.2 mg/dL   GFR calc non Af Amer 42 (L) >60 mL/min   GFR calc Af Amer 48 (L) >60 mL/min    Comment: (NOTE) The eGFR has been calculated using the CKD EPI equation. This calculation has not been validated in all clinical situations. eGFR's persistently <60 mL/min signify possible Chronic Kidney Disease.    Anion gap 10 5 - 15  CBC     Status: Abnormal   Collection Time: 04/22/15  4:55 PM  Result Value Ref Range   WBC 9.7 4.0 - 10.5 K/uL   RBC 3.39 (L) 4.22 - 5.81 MIL/uL   Hemoglobin 10.5 (L)  13.0 - 17.0 g/dL   HCT 33.0 (L) 39.0 - 52.0 %   MCV 97.3 78.0 - 100.0 fL   MCH 31.0 26.0 - 34.0 pg   MCHC 31.8 30.0 - 36.0 g/dL   RDW 14.1 11.5 - 15.5 %   Platelets 274 150 - 400 K/uL   Chest Portable 1 View  04/22/2015  CLINICAL DATA:  Preoperative evaluation for upcoming surgery EXAM: PORTABLE CHEST - 1 VIEW COMPARISON:  05/17/2005 FINDINGS: Cardiac shadow is stable. The lungs are well aerated bilaterally. The previously seen nodular density in the right mid lung is less well appreciated on the current exam. No new focal abnormality is seen. IMPRESSION: No acute abnormality noted. Electronically Signed   By: Inez Catalina M.D.   On: 04/22/2015 16:45    Review of Systems  Constitutional: Negative.   HENT: Negative.   Eyes: Negative.   Respiratory: Negative.   Cardiovascular: Negative.   Gastrointestinal: Negative.   Genitourinary: Negative.   Musculoskeletal: Positive for joint pain.  Skin: Negative.   Neurological: Negative.   Endo/Heme/Allergies: Negative.   Psychiatric/Behavioral: Negative.     Blood pressure 151/89, pulse 66, temperature 97.1 F (36.2 C), temperature source Oral, resp. rate 18, SpO2 100 %. Physical Exam  Constitutional: He appears well-developed.  HENT:  Head: Normocephalic.  Eyes: Pupils are equal, round, and reactive to light.  Neck: Normal range of motion.  Cardiovascular: Normal rate.   Respiratory: Effort normal.  Neurological: He is alert.  Skin: Skin is warm.  Psychiatric: He has a normal mood and affect.   examination of the right leg demonstrates equal leg lengths pain with internal extra rotation on the right-hand side palpable pedal pulses no effusion or crepitus in the knee or ankles bilaterally he has intact flexion and extension at the knee on the right and left side no other masses limited without adenopathy or skin changes noted in the hip region  Assessment/Plan Impression is intertrochanteric fracture right hip this is seen best on  the MRI scan which demonstrates a clear demarcation fracture line going almost through the lesser trochanter. It's not a full fracture yet but extends more than 4 fists the way along the intertrochanteric line. Because the patient's inability to maintain his weightbearing status HEENT we will proceed with intramedullary hip screw fixation with immediate weight bearing. Risk and benefits discussed with the patient earlier today including but  limited to infection nerve vessel damage nonunion malunion need for more surgery. Anticipate overnight stay at the hospital possibly 2 nights with subsequent discharge back to home. All questions answered  DEAN,GREGORY SCOTT 04/22/2015, 7:10 PM

## 2015-04-22 NOTE — Op Note (Signed)
NAMEMarland Kitchen  SAKARI, ALKHATIB NO.:  [id number]  MEDICAL RECORD NO.:  [id number]  LOCATION:  5N09C                        FACILITY:  [facility]  PHYSICIAN:  Anderson Malta, M.D.    DATE OF BIRTH:  01/28/1923  DATE OF PROCEDURE: DATE OF DISCHARGE:                              OPERATIVE REPORT   PREOPERATIVE DIAGNOSIS:  Right intertrochanteric fracture.  POSTOPERATIVE DIAGNOSIS:  Right intertrochanteric fracture.  PROCEDURE:  IMHS [employer] 10 x 38 INTERTAN fracture fixation.  INDICATIONS:  Bruce Webster is a 81 year old patient, hip fracture, presents for operative management after explanation of risks and benefits.  PROCEDURE IN DETAIL:  The patient was brought to the operating room where general anesthetic was induced.  Preoperative antibiotics were administered.  Time-out was called.  Right leg was placed under traction on the Hana bed.  Left leg was placed in lithotomy position, well leg holder with peroneal nerve well-padded.  Prescrubbed with alcohol and Betadine, allowed to air dry, prepped with DuraPrep solution and draped in a sterile manner.  Ioban drape was utilized.  Incision was made 4 fingerbreadths proximal to the tip of the trochanter.  Skin and subcutaneous tissue were sharply divided.  Guidepin placed through the tip of the trochanter in the center of the femur in accordance with preoperative templating.  Proximal femur overreamed, nail placed, 10 x 38.  Through a separate incision, the compression locking screws were placed.  Good fracture, fixation was achieved and sturdy bone.  Direct location confirmed in the AP and lateral planes.  Instruments were removed.  Incisions were then irrigated and closed using 0 Vicryl suture, 2-0 Vicryl suture and 3-0 nylon.  Mepilex dressing was applied. The patient was weightbearing as tolerated.  He will follow up with me in 2 weeks.     Anderson Malta, M.D.     GSD/MEDQ  D:  04/22/2015  T:  04/22/2015   Job:  [id number]

## 2015-04-23 ENCOUNTER — Encounter (HOSPITAL_COMMUNITY): Payer: Self-pay | Admitting: Internal Medicine

## 2015-04-23 DIAGNOSIS — D62 Acute posthemorrhagic anemia: Secondary | ICD-10-CM | POA: Diagnosis not present

## 2015-04-23 DIAGNOSIS — S72141D Displaced intertrochanteric fracture of right femur, subsequent encounter for closed fracture with routine healing: Secondary | ICD-10-CM

## 2015-04-23 DIAGNOSIS — N179 Acute kidney failure, unspecified: Secondary | ICD-10-CM

## 2015-04-23 DIAGNOSIS — N390 Urinary tract infection, site not specified: Secondary | ICD-10-CM | POA: Diagnosis present

## 2015-04-23 HISTORY — DX: Acute kidney failure, unspecified: N17.9

## 2015-04-23 HISTORY — DX: Acute posthemorrhagic anemia: D62

## 2015-04-23 LAB — BASIC METABOLIC PANEL
Anion gap: 8 (ref 5–15)
BUN: 22 mg/dL — AB (ref 6–20)
CALCIUM: 8.9 mg/dL (ref 8.9–10.3)
CO2: 27 mmol/L (ref 22–32)
CREATININE: 1.37 mg/dL — AB (ref 0.61–1.24)
Chloride: 104 mmol/L (ref 101–111)
GFR calc non Af Amer: 44 mL/min — ABNORMAL LOW (ref >60)
GFR, EST AFRICAN AMERICAN: 51 mL/min — AB (ref >60)
Glucose, Bld: 108 mg/dL — ABNORMAL HIGH (ref 65–99)
POTASSIUM: 4.2 mmol/L (ref 3.5–5.1)
SODIUM: 139 mmol/L (ref 135–145)

## 2015-04-23 LAB — URINALYSIS, ROUTINE W REFLEX MICROSCOPIC
BILIRUBIN URINE: NEGATIVE
Glucose, UA: NEGATIVE mg/dL
KETONES UR: 15 mg/dL — AB
NITRITE: NEGATIVE
Protein, ur: NEGATIVE mg/dL
SPECIFIC GRAVITY, URINE: 1.013 (ref 1.005–1.030)
UROBILINOGEN UA: 0.2 mg/dL (ref 0.0–1.0)
pH: 6.5 (ref 5.0–8.0)

## 2015-04-23 LAB — URINE MICROSCOPIC-ADD ON

## 2015-04-23 LAB — CBC
HEMATOCRIT: 30.3 % — AB (ref 39.0–52.0)
HEMOGLOBIN: 9.7 g/dL — AB (ref 13.0–17.0)
MCH: 31.1 pg (ref 26.0–34.0)
MCHC: 32 g/dL (ref 30.0–36.0)
MCV: 97.1 fL (ref 78.0–100.0)
Platelets: 253 10*3/uL (ref 150–400)
RBC: 3.12 MIL/uL — AB (ref 4.22–5.81)
RDW: 13.9 % (ref 11.5–15.5)
WBC: 12.3 10*3/uL — ABNORMAL HIGH (ref 4.0–10.5)

## 2015-04-23 MED ORDER — ENSURE ENLIVE PO LIQD
237.0000 mL | Freq: Two times a day (BID) | ORAL | Status: DC
  Administered 2015-04-23 – 2015-04-26 (×7): 237 mL via ORAL

## 2015-04-23 MED ORDER — LIDOCAINE HCL 2 % EX GEL: 1.0000 "application " | Freq: Once | CUTANEOUS | Status: DC

## 2015-04-23 MED ORDER — ASPIRIN EC 325 MG PO TBEC: 325.0000 mg | DELAYED_RELEASE_TABLET | Freq: Every day | ORAL | Status: DC

## 2015-04-23 MED ORDER — HYDROCODONE-ACETAMINOPHEN 5-325 MG PO TABS: 1.0000 | ORAL_TABLET | Freq: Four times a day (QID) | ORAL | Status: DC | PRN

## 2015-04-23 MED ORDER — LIDOCAINE HCL 2 % EX GEL
1.0000 "application " | Freq: Once | CUTANEOUS | Status: AC
  Administered 2015-04-23: 1 via URETHRAL
  Filled 2015-04-23: qty 20

## 2015-04-23 NOTE — Progress Notes (Signed)
Initial Nutrition Assessment  DOCUMENTATION CODES:   Non-severe (moderate) malnutrition in context of chronic illness  INTERVENTION:   Provide Ensure Enlive po BID, each supplement provides 350 kcal and 20 grams of protein.  Encourage adequate PO intake.   NUTRITION DIAGNOSIS:   Increased nutrient needs related to  (surgery) as evidenced by estimated needs.  GOAL:   Patient will meet greater than or equal to 90% of their needs  MONITOR:   PO intake, Supplement acceptance, Weight trends, Labs, I & O's  REASON FOR ASSESSMENT:   Consult Assessment of nutrition requirement/status  ASSESSMENT:   81 year old patient who fell a week ago. Diagnosed initially with nondisplaced greater trochanter fracture. He has been unable to maintain weightbearing status and reports progressive and debilitating hip pain which is keeping him from being able to walk.  PROCEDURE:(10/20): INTRAMEDULLARY (IM) NAIL RIGHT HIP  Pt reports having a good appetite currently and PTA with consumption of at least 2 meals daily. No percent meal completion recorded. Pt reports he is waiting on his denture glue to arrive so he may eat better at meals. Noted no recent weight recorded, thus pt was weighed on bed scale which revealed 138 lbs. Question accuracy?. Pt reports usual weight has been ~120 lbs. Pt is agreeable to Ensure to aid in caloric and protein needs. RD to order. Pt does reports he has consumed Ensure in the past, however has not been drinking them recently.   Nutrition-Focused physical exam completed. Findings are moderate fat depletion, moderate to severe muscle depletion, and no edema.   Labs and medications reviewed.   Diet Order:  Diet regular Room service appropriate?: Yes; Fluid consistency:: Thin Diet - low sodium heart healthy  Skin:   (Incision on R thigh )  Last BM:  10/16  Height:   Ht Readings from Last 1 Encounters:  04/13/15 6' (1.829 m)    Weight:   Wt Readings from Last  1 Encounters:  04/13/15 120 lb (54.432 kg)    Ideal Body Weight:  80.9 kg  BMI:  There is no weight on file to calculate BMI.  Estimated Nutritional Needs:   Kcal:  1700-1900  Protein:  70-85 grams  Fluid:  1.7 - 1.9 L/day  EDUCATION NEEDS:   No education needs identified at this time  Corrin Parker, MS, RD, LDN Pager # [phone] After hours/ weekend pager # [phone]

## 2015-04-23 NOTE — Anesthesia Postprocedure Evaluation (Signed)
  Anesthesia Post-op Note  Patient: Bruce Webster  Procedure(s) Performed: Procedure(s): INTRAMEDULLARY (IM) NAIL RIGHT HIP (Right)  Patient Location: PACU  Anesthesia Type:General  Level of Consciousness: awake  Airway and Oxygen Therapy: Patient Spontanous Breathing  Post-op Pain: mild  Post-op Assessment: Post-op Vital signs reviewed LLE Motor Response: Purposeful movement LLE Sensation: Full sensation RLE Motor Response: Purposeful movement, Responds to commands RLE Sensation: Full sensation      Post-op Vital Signs: Reviewed  Last Vitals:  Filed Vitals:   04/22/15 2307  BP: 158/61  Pulse: 62  Temp: 36.4 C  Resp: 18    Complications: No apparent anesthesia complications

## 2015-04-23 NOTE — Progress Notes (Signed)
RN inserted coude Foley at 0350 with 1100 cc urine return. Coude secured with patient stating "I already feel relief!"

## 2015-04-23 NOTE — Clinical Social Work Note (Addendum)
Clinical Social Work Assessment  Patient Details  Name: Bruce Webster MRN: [id number] Date of Birth: Nov 11, 1922  Date of referral:  04/23/15               Reason for consult:  Facility Placement                Permission sought to share information with:  Facility Sport and exercise psychologist, Other Permission granted to share information::  Yes, Verbal Permission Granted  Name::     Charlynne Cousins patient friend   Agency::  SNF admissions  Relationship::     Contact Information:     Housing/Transportation Living arrangements for the past 2 months:  [place] of Information:  Patient, Friend/Neighbor Patient Interpreter Needed:  None Criminal Activity/Legal Involvement Pertinent to Current Situation/Hospitalization:  No - Comment as needed Significant Relationships:  Friend Lives with:  Self Do you feel safe going back to the place where you live?  Yes (Patient feels he needs some strengthening and physicial therapy in order to return back home) Need for family participation in patient care:  Yes (Comment) (Patient request to have his friend Bruce Webster involved in decision making for SNF)  Care giving concerns:  Patient feels he needs some short term rehab in order to return back home safely.   Social Worker assessment / plan:  Patient is a pleasant 81 year old male who lives by himself however he has strong support from his friends and neighbors.  Patient does not have any family close by so he has requested to have his friend Bruce Webster involved in decision making.  Patient states he has not been in rehab before but is in agreement to going.  CSW explained to patient how the SNF placement process works and he did not express any concerns about going.  Patient stated he would like to go to [facility] if possible because it is close to home.  Employment status:  Retired Forensic scientist:  Medicare PT Recommendations:  [place] / Referral  to community resources:  [place]  Patient/Family's Response to care:  Patient in agreement to going to SNF for short term rehab.  Patient/Family's Understanding of and Emotional Response to Diagnosis, Current Treatment, and Prognosis:  Patient is aware of his prognosis and current treatment plan  Emotional Assessment Appearance:  Appears stated age Attitude/Demeanor/Rapport:    Affect (typically observed):  Appropriate, Calm, Pleasant, Stable Orientation:  Oriented to Place, Oriented to Self, Oriented to  Time, Oriented to Situation Alcohol / Substance use:  Not Applicable Psych involvement (Current and /or in the community):  No (Comment)  Discharge Needs  Concerns to be addressed:  No discharge needs identified, Lack of Support Readmission within the last 30 days:  No Current discharge risk:  Lives alone Barriers to Discharge:  No Barriers Identified   Bruce Webster 04/23/2015, 9:40 PM

## 2015-04-23 NOTE — Clinical Social Work Placement (Addendum)
   CLINICAL SOCIAL WORK PLACEMENT  NOTE  Date:  04/23/2015  Patient Details  Name: Bruce Webster MRN: [id number] Date of Birth: 25-Dec-1922  Clinical Social Work is seeking post-discharge placement for this patient at the [place] level of care (*CSW will initial, date and re-position this form in  chart as items are completed):  Yes   Patient/family provided with [place] Work Department's list of facilities offering this level of care within the geographic area requested by the patient (or if unable, by the patient's family).  Yes   Patient/family informed of their freedom to choose among providers that offer the needed level of care, that participate in Medicare, Medicaid or managed care program needed by the patient, have an available bed and are willing to accept the patient.  Yes   Patient/family informed of 's ownership interest in [facility] and [facility], as well as of the fact that they are under no obligation to receive care at these facilities.  PASRR submitted to EDS on 04/23/15     PASRR number received on 04/23/15     Existing PASRR number confirmed on       FL2 transmitted to all facilities in geographic area requested by pt/family on 04/23/15     FL2 transmitted to all facilities within larger geographic area on 04/23/15     Patient informed that his/her managed care company has contracts with or will negotiate with certain facilities, including the following:         04/24/15   Patient/family informed of bed offers received.  Patient chooses bed at  [facility]     Physician recommends and patient chooses bed at      Patient to be transferred to  [facility] on  04/26/15.  Patient to be transferred to facility by  [facility]     Patient family notified on  04/26/15 of transfer.  Name of family member notified:   Charlynne Cousins friend and caregiver      PHYSICIAN Please sign FL2      Additional Comment:    _______________________________________________ Ross Ludwig, LCSWA 04/23/2015, 9:49 PM

## 2015-04-23 NOTE — Progress Notes (Signed)
[facility] PROGRESS NOTE  Bruce Webster VOJ:[id number] DOB: 04-Nov-1922 DOA: 04/22/2015 PCP: Asencion Noble, MD   brief narrative 81 year old male living independently with history of hypertension and bladder cancer who had a mechanical fall at home 1 week back was diagnosed in the ED initially with a nondisplaced greater trochanter fracture and was discharged home with recommendations for partial weightbearing and follow-up as outpatient by orthopedic surgery. He was seen in the office on 10/20. He was unable to be in weight and had progressive debilitating hip pain limiting his mobility. He had an MRI done which showed right intertrochanteric hip fracture. Patient admitted to hospitalist service and taken to or for IM hip screw fixation. Tolerated surgery well.  Assessment/Plan: Right hip fracture Secondary to mechanical fall. Patient underwent IM nail placement. Pain control with when necessary morphine, Vicodin and Robaxin. Will discontinue Foley. PT evaluation. Patient lives alone. Will need skilled nursing facility upon discharge (possibly 10/22 or 10/24 ) Full dose aspirin for DVT prophylaxis.  Essential hypertension Stable. Continue amlodipine.  Acute kidney injury No baseline available. Hold lisinopril. Monitor with gentle hydration.  ? UTI Has positive UA. Will treat with empiric Rocephin.  DVT prophylaxis: Full dose aspirin  Code Status: Full code Family Communication: None at bedside Disposition Plan: [facility] nursing facility   Consultants:  Dr Marlou Sa ( ortho)  Procedures:  IM nail rt hip  Antibiotics:  IV rocephin  HPI/Subjective: Seen and examined. Denies right hip pain at this time.  Objective: Filed Vitals:   04/23/15 0509  BP: 121/48  Pulse: 61  Temp: 97.9 F (36.6 C)  Resp: 18    Intake/Output Summary (Last 24 hours) at 04/23/15 1028 Last data filed at 04/23/15 0731  Gross per 24 hour  Intake 2199.17 ml  Output   1200 ml  Net 999.17 ml    There were no vitals filed for this visit.  Exam:   General:  Elderly male in no acute distress, hard of hearing  HEENT: No pallor, moist oral mucosa  Chest: Clear to auscultation bilaterally    CVS: Normal S1 and S2, no murmurs  GI: Soft, nondistended, nontender, bowel sounds present  Musculoskeletal: Dressing over her right hip pain, no edema    Data Reviewed: Basic Metabolic Panel:  Recent Labs Lab 04/22/15 1655 04/23/15 0443  NA 140 139  K 4.3 4.2  CL 106 104  CO2 24 27  GLUCOSE 91 108*  BUN 30* 22*  CREATININE 1.43* 1.37*  CALCIUM 9.2 8.9   Liver Function Tests:  Recent Labs Lab 04/22/15 1655  AST 35  ALT 42  ALKPHOS 135*  BILITOT 0.5  PROT 6.4*  ALBUMIN 3.3*   No results for input(s): LIPASE, AMYLASE in the last 168 hours. No results for input(s): AMMONIA in the last 168 hours. CBC:  Recent Labs Lab 04/22/15 1655 04/23/15 0443  WBC 9.7 12.3*  HGB 10.5* 9.7*  HCT 33.0* 30.3*  MCV 97.3 97.1  PLT 274 253   Cardiac Enzymes: No results for input(s): CKTOTAL, CKMB, CKMBINDEX, TROPONINI in the last 168 hours. BNP (last 3 results) No results for input(s): BNP in the last 8760 hours.  ProBNP (last 3 results) No results for input(s): PROBNP in the last 8760 hours.  CBG: No results for input(s): GLUCAP in the last 168 hours.  Recent Results (from the past 240 hour(s))  Surgical pcr screen     Status: None   Collection Time: 04/22/15  7:27 PM  Result Value Ref Range Status  MRSA, PCR NEGATIVE NEGATIVE Final   Staphylococcus aureus NEGATIVE NEGATIVE Final    Comment:        The Xpert SA Assay (FDA approved for NASAL specimens in patients over 47 years of age), is one component of a comprehensive surveillance program.  Test performance has been validated by [facility] for patients greater than or equal to 1 year old. It is not intended to diagnose infection nor to guide or monitor treatment.      Studies: Pelvis  Portable  04/22/2015  CLINICAL DATA:  Status post right femoral ORIF EXAM: PORTABLE PELVIS 1-2 VIEWS COMPARISON:  04/13/2015 FINDINGS: Medullary rod is noted with 2 fixation screws traversing the femoral neck. No acute abnormality is noted. No soft tissue changes are seen. IMPRESSION: Status post ORIF of proximal right femoral fracture Electronically Signed   By: Inez Catalina M.D.   On: 04/22/2015 23:26   Chest Portable 1 View  04/22/2015  CLINICAL DATA:  Preoperative evaluation for upcoming surgery EXAM: PORTABLE CHEST - 1 VIEW COMPARISON:  05/17/2005 FINDINGS: Cardiac shadow is stable. The lungs are well aerated bilaterally. The previously seen nodular density in the right mid lung is less well appreciated on the current exam. No new focal abnormality is seen. IMPRESSION: No acute abnormality noted. Electronically Signed   By: Inez Catalina M.D.   On: 04/22/2015 16:45   Dg Hip Operative Unilat With Pelvis Right  04/22/2015  CLINICAL DATA:  Intertrochanteric femur fracture fixation. EXAM: OPERATIVE RIGHT HIP (WITH PELVIS IF PERFORMED) 3 VIEWS TECHNIQUE: Fluoroscopic spot image(s) were submitted for interpretation post-operatively. COMPARISON:  Radiographs and MRI 04/13/2015. FINDINGS: Three spot fluoroscopic images demonstrate open reduction and internal fixation of the intertrochanteric femur fracture with a dynamic screw and intramedullary rod. The hardware appears well positioned. No complications identified. IMPRESSION: Right femoral ORIF without demonstrated complication. Electronically Signed   By: Richardean Sale M.D.   On: 04/22/2015 21:18    Scheduled Meds: . amLODipine  5 mg Oral Daily  . aspirin EC  325 mg Oral Q breakfast   Continuous Infusions: . sodium chloride 50 mL/hr at 04/22/15 2246      Time spent: 25 minutes    Louellen Molder  [facility] Pager [phone] If 7PM-7AM, please contact night-coverage at [email or web address], password [facility] 04/23/2015, 10:28 AM  LOS: 1 day

## 2015-04-23 NOTE — Progress Notes (Signed)
Pt stable Walking Ok to dc once safe with PT rx on chart

## 2015-04-23 NOTE — Evaluation (Signed)
Physical Therapy Evaluation Patient Details Name: Bruce Webster MRN: [id number] DOB: 07/21/1922 Today's Date: 04/23/2015   History of Present Illness  81 y.o. male who reportedly fell at home approximately one week ago. Pt has since been diagnosed with an intertrochanteric fracture right and now s/p IM nailing on 04/22/15.  PMH: hypertension  Clinical Impression  Patient is s/p above surgery resulting in functional limitations due to the deficits listed below (see PT Problem List). Patient will benefit from skilled PT to increase their independence and safety with mobility to allow discharge to the venue listed below. Patient reporting that he wants to go directly home from the hospital but he does not have any available assistance. Based upon the patient's current mobility and risk of falls, recommending SNF at this time.        Follow Up Recommendations SNF;Supervision/Assistance - 24 hour    Equipment Recommendations  None recommended by PT;Other (comment) (patient reports having rw, rollator and cane at home)    Recommendations for Other Services OT consult     Precautions / Restrictions Precautions Precautions: Fall Restrictions Weight Bearing Restrictions: Yes RLE Weight Bearing: Weight bearing as tolerated      Mobility  Bed Mobility Overal bed mobility: Needs Assistance Bed Mobility: Supine to Sit;Sit to Supine     Supine to sit: Min assist Sit to supine: Supervision   General bed mobility comments: cues as needed for sequence to go supine to sitting, using bed rail to assist.   Transfers Overall transfer level: Needs assistance Equipment used: Rolling walker (2 wheeled) Transfers: Sit to/from Stand Sit to Stand: Min assist;Min guard         General transfer comment: repeated cues needed for hand placement and safety with transfers.   Ambulation/Gait Ambulation/Gait assistance: Min guard Ambulation Distance (Feet): 50 Feet (30 feet X1, 20 feet  X1) Assistive device: Rolling walker (2 wheeled) Gait Pattern/deviations: Step-through pattern;Decreased weight shift to right;Decreased stance time - right Gait velocity: decreased   General Gait Details: Patient using upper extremities to unweight Rt LE with stance phase.   Stairs            Wheelchair Mobility    Modified Rankin (Stroke Patients Only)       Balance Overall balance assessment: Needs assistance Sitting-balance support: No upper extremity supported Sitting balance-Leahy Scale: Fair     Standing balance support: Bilateral upper extremity supported Standing balance-Leahy Scale: Poor Standing balance comment: using rw                             Pertinent Vitals/Pain Pain Assessment: No/denies pain    Home Living Family/patient expects to be discharged to:: Private residence Living Arrangements: Alone Available Help at Discharge: Other (Comment) (none, neighbors may be able to look in on him. ) Type of Home: House Home Access: Stairs to enter Entrance Stairs-Rails: None Entrance Stairs-Number of Steps: 1 Home Layout: Able to live on main level with bedroom/bathroom Home Equipment: Walker - 2 wheels;Walker - 4 wheels;Cane - single point      Prior Function Level of Independence: Independent with assistive device(s)         Comments: using SPC     Hand Dominance        Extremity/Trunk Assessment               Lower Extremity Assessment: RLE deficits/detail RLE Deficits / Details: able to move in bed with min assist  Communication   Communication: No difficulties  Cognition Arousal/Alertness: Awake/alert Behavior During Therapy: WFL for tasks assessed/performed Overall Cognitive Status: Within Functional Limits for tasks assessed                      General Comments General comments (skin integrity, edema, etc.): Unable to leave patient up in chair after session as no chair alarm available on  floor. Nursing notified and patient returned to bed for safety. SpO2 92% on room air after ambulation.     Exercises        Assessment/Plan    PT Assessment Patient needs continued PT services  PT Diagnosis Difficulty walking   PT Problem List Decreased strength;Decreased range of motion;Decreased activity tolerance;Decreased balance;Decreased mobility;Decreased knowledge of use of DME;Decreased safety awareness  PT Treatment Interventions DME instruction;Gait training;Stair training;Functional mobility training;Therapeutic activities;Therapeutic exercise;Balance training;Patient/family education   PT Goals (Current goals can be found in the Care Plan section) Acute Rehab PT Goals Patient Stated Goal: Be able to get back home  PT Goal Formulation: With patient Time For Goal Achievement: 05/07/15 Potential to Achieve Goals: Fair    Frequency Min 5X/week   Barriers to discharge Decreased caregiver support      Co-evaluation               End of Session Equipment Utilized During Treatment: Gait belt Activity Tolerance: Patient tolerated treatment well Patient left: in bed;with call bell/phone within reach;with bed alarm set;with SCD's reapplied Nurse Communication: Mobility status;Precautions;Weight bearing status (reason for return to bed)         Time: 9562-1308 PT Time Calculation (min) (ACUTE ONLY): 25 min   Charges:   PT Evaluation $Initial PT Evaluation Tier I: 1 Procedure PT Treatments $Gait Training: 8-22 mins   PT G Codes:        Cassell Clement, PT, CSCS Pager [phone] Office [phone]  04/23/2015, 10:20 AM

## 2015-04-24 DIAGNOSIS — E440 Moderate protein-calorie malnutrition: Secondary | ICD-10-CM | POA: Diagnosis present

## 2015-04-24 LAB — BASIC METABOLIC PANEL
Anion gap: 8 (ref 5–15)
BUN: 22 mg/dL — AB (ref 6–20)
CO2: 26 mmol/L (ref 22–32)
CREATININE: 1.38 mg/dL — AB (ref 0.61–1.24)
Calcium: 8.7 mg/dL — ABNORMAL LOW (ref 8.9–10.3)
Chloride: 102 mmol/L (ref 101–111)
GFR calc Af Amer: 50 mL/min — ABNORMAL LOW (ref >60)
GFR, EST NON AFRICAN AMERICAN: 43 mL/min — AB (ref >60)
Glucose, Bld: 108 mg/dL — ABNORMAL HIGH (ref 65–99)
Potassium: 4.3 mmol/L (ref 3.5–5.1)
SODIUM: 136 mmol/L (ref 135–145)

## 2015-04-24 LAB — CBC
HCT: 28.3 % — ABNORMAL LOW (ref 39.0–52.0)
Hemoglobin: 9.5 g/dL — ABNORMAL LOW (ref 13.0–17.0)
MCH: 32.1 pg (ref 26.0–34.0)
MCHC: 33.6 g/dL (ref 30.0–36.0)
MCV: 95.6 fL (ref 78.0–100.0)
PLATELETS: 237 10*3/uL (ref 150–400)
RBC: 2.96 MIL/uL — ABNORMAL LOW (ref 4.22–5.81)
RDW: 14 % (ref 11.5–15.5)
WBC: 12.1 10*3/uL — ABNORMAL HIGH (ref 4.0–10.5)

## 2015-04-24 MED ORDER — CEFTRIAXONE SODIUM 1 G IJ SOLR
1.0000 g | INTRAMUSCULAR | Status: DC
  Administered 2015-04-24 – 2015-04-26 (×3): 1 g via INTRAVENOUS
  Filled 2015-04-24 (×3): qty 10

## 2015-04-24 NOTE — Progress Notes (Signed)
OT Cancellation Note  Patient Details Name: Bruce Webster MRN: [id number] DOB: October 26, 1922   Cancelled Treatment:    Reason Eval/Treat Not Completed:  (OT screened) Pt is Medicare and current D/C plan is SNF. No apparent immediate acute care OT needs, therefore will defer OT to SNF. If OT eval is needed please call Acute Rehab Dept. at [phone] or text page OT at [phone].    Benito Mccreedy OTR/L [phone] 04/24/2015, 4:16 PM

## 2015-04-24 NOTE — Care Management Note (Signed)
Case Management Note  Patient Details  Name: Bruce Webster MRN: [id number] Date of Birth: Apr 05, 1923  Subjective/Objective:                  right intertrochanteric fracture post fall and now s/p IM nailing on 04/22/15. PMH: hypertension  Action/Plan: CM notes that SW is following for SNF placement and awaiting bed availability based on pt preference. CM remains available for any further discharge planning needs.   Expected Discharge Date:  04/25/15               Expected Discharge Plan:  Skilled Nursing Facility  In-House Referral:  Clinical Social Work  Discharge planning Services  CM Consult  Post Acute Care Choice:    Choice offered to:     DME Arranged:    DME Agency:     HH Arranged:    [place] Agency:     Status of Service:  In process, will continue to follow  Medicare Important Message Given:    Date Medicare IM Given:    Medicare IM give by:    Date Additional Medicare IM Given:    Additional Medicare Important Message give by:     If discussed at [place] of Stay Meetings, dates discussed:    Additional Comments:  Guido Sander, RN 04/24/2015, 3:59 PM

## 2015-04-24 NOTE — Progress Notes (Signed)
[facility] PROGRESS NOTE  EMIR NACK RKY:[id number] DOB: 1922/11/26 DOA: 04/22/2015 PCP: Asencion Noble, MD   brief narrative 81 year old male living independently with history of hypertension and bladder cancer who had a mechanical fall at home 1 week back was diagnosed in the ED initially with a nondisplaced greater trochanter fracture and was discharged home with recommendations for partial weightbearing and follow-up as outpatient by orthopedic surgery. He was seen in the office on 10/20. He was unable to be in weight and had progressive debilitating hip pain limiting his mobility. He had an MRI done which showed right intertrochanteric hip fracture. Patient admitted to hospitalist service and taken to or for IM hip screw fixation. Tolerated surgery well.  Assessment/Plan: Right hip fracture Secondary to mechanical fall. Patient underwent IM nail placement. Pain control with when necessary  Vicodin and Robaxin.  discontinue Foley. (Patient reports having urinary retention upon admission. Will monitor) PT evaluated patient and recommended skilled nursing facility. Patient wants to go to [facility] nursing [facility]. (Possible discharge on 10/24) Full dose aspirin for DVT prophylaxis.  Essential hypertension Stable. Continue amlodipine.  Acute kidney injury No baseline available. Hold lisinopril. Continue gentle hydration.  ? UTI Has positive UA. Will treat with empiric Rocephin.  Acute blood loss anemia  post op. Stable. Monitor  Moderate protein calorie malnutrition Added supplement.  DVT prophylaxis: Full dose aspirin  Diet: Regular   Code Status: Full code Family Communication: None at bedside Disposition Plan: Skilled nursing facility 10/24   Consultants:  Dr Marlou Sa ( ortho)  Procedures:  IM nail rt hip  Antibiotics:  IV rocephin  HPI/Subjective: Seen and examined. No overnight issues. Minimal pain.  Objective: Filed Vitals:   04/24/15 1005  BP: 150/60   Pulse:   Temp:   Resp:     Intake/Output Summary (Last 24 hours) at 04/24/15 1318 Last data filed at 04/24/15 1100  Gross per 24 hour  Intake 1591.17 ml  Output   2300 ml  Net -708.83 ml   There were no vitals filed for this visit.  Exam:   General:   no acute distress, hard of hearing  HEENT: , moist oral mucosa  Chest: Clear to auscultation bilaterally    CVS: Normal S1 and S2, no murmurs  GI: Soft, nondistended, nontender, bowel sounds present  Musculoskeletal: Dressing over her right hip , no edema    Data Reviewed: Basic Metabolic Panel:  Recent Labs Lab 04/22/15 1655 04/23/15 0443 04/24/15 0345  NA 140 139 136  K 4.3 4.2 4.3  CL 106 104 102  CO2 '24 27 26  '$ GLUCOSE 91 108* 108*  BUN 30* 22* 22*  CREATININE 1.43* 1.37* 1.38*  CALCIUM 9.2 8.9 8.7*   Liver Function Tests:  Recent Labs Lab 04/22/15 1655  AST 35  ALT 42  ALKPHOS 135*  BILITOT 0.5  PROT 6.4*  ALBUMIN 3.3*   No results for input(s): LIPASE, AMYLASE in the last 168 hours. No results for input(s): AMMONIA in the last 168 hours. CBC:  Recent Labs Lab 04/22/15 1655 04/23/15 0443 04/24/15 0345  WBC 9.7 12.3* 12.1*  HGB 10.5* 9.7* 9.5*  HCT 33.0* 30.3* 28.3*  MCV 97.3 97.1 95.6  PLT 274 253 237   Cardiac Enzymes: No results for input(s): CKTOTAL, CKMB, CKMBINDEX, TROPONINI in the last 168 hours. BNP (last 3 results) No results for input(s): BNP in the last 8760 hours.  ProBNP (last 3 results) No results for input(s): PROBNP in the last 8760 hours.  CBG: No results  for input(s): GLUCAP in the last 168 hours.  Recent Results (from the past 240 hour(s))  Surgical pcr screen     Status: None   Collection Time: 04/22/15  7:27 PM  Result Value Ref Range Status   MRSA, PCR NEGATIVE NEGATIVE Final   Staphylococcus aureus NEGATIVE NEGATIVE Final    Comment:        The Xpert SA Assay (FDA approved for NASAL specimens in patients over 5 years of age), is one component  of a comprehensive surveillance program.  Test performance has been validated by [facility] for patients greater than or equal to 75 year old. It is not intended to diagnose infection nor to guide or monitor treatment.      Studies: Pelvis Portable  04/22/2015  CLINICAL DATA:  Status post right femoral ORIF EXAM: PORTABLE PELVIS 1-2 VIEWS COMPARISON:  04/13/2015 FINDINGS: Medullary rod is noted with 2 fixation screws traversing the femoral neck. No acute abnormality is noted. No soft tissue changes are seen. IMPRESSION: Status post ORIF of proximal right femoral fracture Electronically Signed   By: Inez Catalina M.D.   On: 04/22/2015 23:26   Chest Portable 1 View  04/22/2015  CLINICAL DATA:  Preoperative evaluation for upcoming surgery EXAM: PORTABLE CHEST - 1 VIEW COMPARISON:  05/17/2005 FINDINGS: Cardiac shadow is stable. The lungs are well aerated bilaterally. The previously seen nodular density in the right mid lung is less well appreciated on the current exam. No new focal abnormality is seen. IMPRESSION: No acute abnormality noted. Electronically Signed   By: Inez Catalina M.D.   On: 04/22/2015 16:45   Dg Hip Operative Unilat With Pelvis Right  04/22/2015  CLINICAL DATA:  Intertrochanteric femur fracture fixation. EXAM: OPERATIVE RIGHT HIP (WITH PELVIS IF PERFORMED) 3 VIEWS TECHNIQUE: Fluoroscopic spot image(s) were submitted for interpretation post-operatively. COMPARISON:  Radiographs and MRI 04/13/2015. FINDINGS: Three spot fluoroscopic images demonstrate open reduction and internal fixation of the intertrochanteric femur fracture with a dynamic screw and intramedullary rod. The hardware appears well positioned. No complications identified. IMPRESSION: Right femoral ORIF without demonstrated complication. Electronically Signed   By: Richardean Sale M.D.   On: 04/22/2015 21:18    Scheduled Meds: . amLODipine  5 mg Oral Daily  . aspirin EC  325 mg Oral Q breakfast  . cefTRIAXone  (ROCEPHIN)  IV  1 g Intravenous Q24H  . feeding supplement (ENSURE ENLIVE)  237 mL Oral BID BM   Continuous Infusions: . sodium chloride 50 mL/hr at 04/23/15 1807      Time spent: 25 minutes    Deysha Cartier  [facility] Pager [phone] If 7PM-7AM, please contact night-coverage at [email or web address], password [facility] 04/24/2015, 1:18 PM  LOS: 2 days

## 2015-04-24 NOTE — Progress Notes (Signed)
LCSW covering the weekend met with patient at the bedside to discuss discharging options, bed offers and plans. Patient very pleasant and cooperative during discussion.  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  Patient eager to talk and engage.  Bed offers given to patient who politely declined them all. Reports he lives in [place] and would like to go to [facility] if at all possible. Call placed for [facility] to follow up, but at this time, they have not responded via [place].  Will follow up with patient Monday and call [facility] if they still have not responded. Patient reports he has a lot of neighborhood friends who look after him and at times are a bit nosey and overstay there welcome, but reports good support. Declines any calls to be made to anyone this afternoon.  Will follow up with bed offers again and DC plan pending Sunday/Monday.  Lane Hacker, MSW Clinical Social Work: Emergency Room [phone]

## 2015-04-25 ENCOUNTER — Encounter (HOSPITAL_COMMUNITY): Payer: Self-pay

## 2015-04-25 DIAGNOSIS — N3000 Acute cystitis without hematuria: Secondary | ICD-10-CM

## 2015-04-25 LAB — BASIC METABOLIC PANEL
Anion gap: 9 (ref 5–15)
BUN: 21 mg/dL — ABNORMAL HIGH (ref 6–20)
CALCIUM: 8.7 mg/dL — AB (ref 8.9–10.3)
CO2: 24 mmol/L (ref 22–32)
CREATININE: 1.17 mg/dL (ref 0.61–1.24)
Chloride: 102 mmol/L (ref 101–111)
GFR calc Af Amer: >60 mL/min (ref >60)
GFR calc non Af Amer: 53 mL/min — ABNORMAL LOW (ref >60)
GLUCOSE: 101 mg/dL — AB (ref 65–99)
Potassium: 4.5 mmol/L (ref 3.5–5.1)
Sodium: 135 mmol/L (ref 135–145)

## 2015-04-25 LAB — CBC
HEMATOCRIT: 28.4 % — AB (ref 39.0–52.0)
Hemoglobin: 9.3 g/dL — ABNORMAL LOW (ref 13.0–17.0)
MCH: 31.4 pg (ref 26.0–34.0)
MCHC: 32.7 g/dL (ref 30.0–36.0)
MCV: 95.9 fL (ref 78.0–100.0)
PLATELETS: 260 10*3/uL (ref 150–400)
RBC: 2.96 MIL/uL — ABNORMAL LOW (ref 4.22–5.81)
RDW: 13.9 % (ref 11.5–15.5)
WBC: 12.3 10*3/uL — AB (ref 4.0–10.5)

## 2015-04-25 MED ORDER — PNEUMOCOCCAL VAC POLYVALENT 25 MCG/0.5ML IJ INJ
0.5000 mL | INJECTION | INTRAMUSCULAR | Status: AC
  Administered 2015-04-26: 0.5 mL via INTRAMUSCULAR
  Filled 2015-04-25: qty 0.5

## 2015-04-25 MED ORDER — INFLUENZA VAC SPLIT QUAD 0.5 ML IM SUSY
0.5000 mL | PREFILLED_SYRINGE | INTRAMUSCULAR | Status: DC
  Filled 2015-04-25: qty 0.5

## 2015-04-25 NOTE — Progress Notes (Signed)
[facility] PROGRESS NOTE  Bruce Webster ZOX:[id number] DOB: 1923-04-11 DOA: 04/22/2015 PCP: Asencion Noble, MD   brief narrative 81 year old male living independently with history of hypertension and bladder cancer who had a mechanical fall at home 1 week back was diagnosed in the ED initially with a nondisplaced greater trochanter fracture and was discharged home with recommendations for partial weightbearing and follow-up as outpatient by orthopedic surgery. He was seen in the office on 10/20. He was unable to be in weight and had progressive debilitating hip pain limiting his mobility. He had an MRI done which showed right intertrochanteric hip fracture. Patient admitted to hospitalist service and taken to or for IM hip screw fixation. Tolerated surgery well.  Assessment/Plan: Right hip fracture Secondary to mechanical fall. Patient underwent IM nail placement. Pain control with when necessary  Vicodin and Robaxin.  PT  recommends skilled nursing facility. Patient wants to go to [facility]. (Possible discharge on 10/24) Full dose aspirin for DVT prophylaxis.  Essential hypertension Stable. Continue amlodipine.  Acute kidney injury No baseline available. Hold lisinopril. Continue gentle hydration.  ? UTI Has positive UA. on empiric Rocephin.  Acute blood loss anemia  post op. Stable. Monitor  Moderate protein calorie malnutrition Added supplement.  DVT prophylaxis: Full dose aspirin  Diet: Regular   Code Status: Full code Family Communication: None at bedside Disposition Plan: Skilled nursing facility 10/24   Consultants:  Dr Marlou Sa ( ortho)  Procedures:  IM nail rt hip  Antibiotics:  IV rocephin  HPI/Subjective:  No overnight issues.   Objective: Filed Vitals:   04/25/15 0611  BP: 141/59  Pulse: 67  Temp: 98.6 F (37 C)  Resp: 18    Intake/Output Summary (Last 24 hours) at 04/25/15 1200 Last data filed at 04/24/15 2115  Gross per 24  hour  Intake     20 ml  Output    650 ml  Net   -630 ml   There were no vitals filed for this visit.  Exam:   General:   no acute distress, hard of hearing  HEENT: , moist oral mucosa  Chest: Clear to auscultation bilaterally    CVS: Normal S1 and S2, no murmurs  GI: Soft, nondistended, nontender,   Musculoskeletal: Dressing over her right hip , no edema    Data Reviewed: Basic Metabolic Panel:  Recent Labs Lab 04/22/15 1655 04/23/15 0443 04/24/15 0345 04/25/15 0456  NA 140 139 136 135  K 4.3 4.2 4.3 4.5  CL 106 104 102 102  CO2 '24 27 26 24  '$ GLUCOSE 91 108* 108* 101*  BUN 30* 22* 22* 21*  CREATININE 1.43* 1.37* 1.38* 1.17  CALCIUM 9.2 8.9 8.7* 8.7*   Liver Function Tests:  Recent Labs Lab 04/22/15 1655  AST 35  ALT 42  ALKPHOS 135*  BILITOT 0.5  PROT 6.4*  ALBUMIN 3.3*   No results for input(s): LIPASE, AMYLASE in the last 168 hours. No results for input(s): AMMONIA in the last 168 hours. CBC:  Recent Labs Lab 04/22/15 1655 04/23/15 0443 04/24/15 0345 04/25/15 0456  WBC 9.7 12.3* 12.1* 12.3*  HGB 10.5* 9.7* 9.5* 9.3*  HCT 33.0* 30.3* 28.3* 28.4*  MCV 97.3 97.1 95.6 95.9  PLT 274 253 237 260   Cardiac Enzymes: No results for input(s): CKTOTAL, CKMB, CKMBINDEX, TROPONINI in the last 168 hours. BNP (last 3 results) No results for input(s): BNP in the last 8760 hours.  ProBNP (last 3 results) No results for input(s): PROBNP in the  last 8760 hours.  CBG: No results for input(s): GLUCAP in the last 168 hours.  Recent Results (from the past 240 hour(s))  Surgical pcr screen     Status: None   Collection Time: 04/22/15  7:27 PM  Result Value Ref Range Status   MRSA, PCR NEGATIVE NEGATIVE Final   Staphylococcus aureus NEGATIVE NEGATIVE Final    Comment:        The Xpert SA Assay (FDA approved for NASAL specimens in patients over 2 years of age), is one component of a comprehensive surveillance program.  Test performance has been  validated by [facility] for patients greater than or equal to 49 year old. It is not intended to diagnose infection nor to guide or monitor treatment.      Studies: No results found.  Scheduled Meds: . amLODipine  5 mg Oral Daily  . aspirin EC  325 mg Oral Q breakfast  . cefTRIAXone (ROCEPHIN)  IV  1 g Intravenous Q24H  . feeding supplement (ENSURE ENLIVE)  237 mL Oral BID BM  . [START ON 04/26/2015] Influenza vac split quadrivalent PF  0.5 mL Intramuscular Tomorrow-1000  . [START ON 04/26/2015] pneumococcal 23 valent vaccine  0.5 mL Intramuscular Tomorrow-1000   Continuous Infusions: . sodium chloride 50 mL/hr at 04/24/15 1345      Time spent: 25 minutes    Bruce Webster  [facility] Pager [phone] If 7PM-7AM, please contact night-coverage at [email or web address], password [facility] 04/25/2015, 12:00 PM  LOS: 3 days

## 2015-04-26 ENCOUNTER — Inpatient Hospital Stay
Admission: RE | Admit: 2015-04-26 | Discharge: 2015-05-07 | Disposition: A | Discharge status: Home / Self Care | Payer: Medicare Other | Source: Ambulatory Visit | Attending: Internal Medicine | Admitting: Internal Medicine

## 2015-04-26 DIAGNOSIS — R262 Difficulty in walking, not elsewhere classified: Secondary | ICD-10-CM | POA: Diagnosis not present

## 2015-04-26 DIAGNOSIS — Z87891 Personal history of nicotine dependence: Secondary | ICD-10-CM | POA: Diagnosis not present

## 2015-04-26 DIAGNOSIS — I10 Essential (primary) hypertension: Secondary | ICD-10-CM | POA: Insufficient documentation

## 2015-04-26 DIAGNOSIS — Z8551 Personal history of malignant neoplasm of bladder: Secondary | ICD-10-CM | POA: Diagnosis not present

## 2015-04-26 DIAGNOSIS — M84359A Stress fracture, hip, unspecified, initial encounter for fracture: Secondary | ICD-10-CM | POA: Diagnosis not present

## 2015-04-26 DIAGNOSIS — M21251 Flexion deformity, right hip: Secondary | ICD-10-CM | POA: Diagnosis not present

## 2015-04-26 DIAGNOSIS — M6281 Muscle weakness (generalized): Secondary | ICD-10-CM | POA: Diagnosis not present

## 2015-04-26 DIAGNOSIS — Z79899 Other long term (current) drug therapy: Secondary | ICD-10-CM | POA: Diagnosis not present

## 2015-04-26 DIAGNOSIS — Z681 Body mass index (BMI) 19 or less, adult: Secondary | ICD-10-CM | POA: Diagnosis not present

## 2015-04-26 DIAGNOSIS — Z4789 Encounter for other orthopedic aftercare: Secondary | ICD-10-CM | POA: Diagnosis not present

## 2015-04-26 DIAGNOSIS — N390 Urinary tract infection, site not specified: Secondary | ICD-10-CM | POA: Diagnosis not present

## 2015-04-26 DIAGNOSIS — W1830XA Fall on same level, unspecified, initial encounter: Secondary | ICD-10-CM | POA: Diagnosis not present

## 2015-04-26 DIAGNOSIS — E440 Moderate protein-calorie malnutrition: Secondary | ICD-10-CM

## 2015-04-26 DIAGNOSIS — Z9981 Dependence on supplemental oxygen: Secondary | ICD-10-CM | POA: Diagnosis not present

## 2015-04-26 DIAGNOSIS — S72114A Nondisplaced fracture of greater trochanter of right femur, initial encounter for closed fracture: Secondary | ICD-10-CM | POA: Diagnosis present

## 2015-04-26 DIAGNOSIS — S72141D Displaced intertrochanteric fracture of right femur, subsequent encounter for closed fracture with routine healing: Secondary | ICD-10-CM | POA: Diagnosis not present

## 2015-04-26 DIAGNOSIS — D62 Acute posthemorrhagic anemia: Secondary | ICD-10-CM | POA: Diagnosis not present

## 2015-04-26 DIAGNOSIS — Z9181 History of falling: Secondary | ICD-10-CM | POA: Diagnosis not present

## 2015-04-26 DIAGNOSIS — N179 Acute kidney failure, unspecified: Secondary | ICD-10-CM | POA: Diagnosis not present

## 2015-04-26 DIAGNOSIS — R278 Other lack of coordination: Secondary | ICD-10-CM | POA: Diagnosis not present

## 2015-04-26 DIAGNOSIS — M25551 Pain in right hip: Secondary | ICD-10-CM

## 2015-04-26 MED ORDER — CEPHALEXIN 500 MG PO CAPS: 500.0000 mg | ORAL_CAPSULE | Freq: Two times a day (BID) | ORAL | Status: AC

## 2015-04-26 MED ORDER — AMLODIPINE BESYLATE 10 MG PO TABS
10.0000 mg | ORAL_TABLET | Freq: Every day | ORAL | Status: DC
  Administered 2015-04-26: 10 mg via ORAL
  Filled 2015-04-26: qty 1

## 2015-04-26 MED ORDER — ENSURE ENLIVE PO LIQD: 237.0000 mL | Freq: Two times a day (BID) | ORAL | Status: DC

## 2015-04-26 MED ORDER — AMLODIPINE BESYLATE 10 MG PO TABS
10.0000 mg | ORAL_TABLET | Freq: Every day | ORAL | Status: DC
Start: 2015-04-26 — End: 2016-12-17

## 2015-04-26 NOTE — Care Management Important Message (Signed)
Important Message  Patient Details  Name: Bruce Webster MRN: [id number] Date of Birth: 1922/08/15   Medicare Important Message Given:  Yes-second notification given    Delorse Lek 04/26/2015, 12:22 PM

## 2015-04-26 NOTE — Discharge Instructions (Signed)
WEIGHT BEARING AND INCREASE ACTIVITY AS TOLERATED.

## 2015-04-26 NOTE — Progress Notes (Signed)
Physical Therapy Treatment Patient Details Name: Bruce Webster MRN: [id number] DOB: 02/28/1923 Today's Date: 04/26/2015    History of Present Illness 81 y.o. male who reportedly fell at home approximately one week ago. Pt has since been diagnosed with an intertrochanteric fracture right and now s/p IM nailing on 04/22/15.  PMH: hypertension    PT Comments    Patient making gradual progress with mobility at this time. Patient reports that Rt hip is sore but would not describe it as pain. Continue to recommend SNF upon D/C for further rehabilitation and progression of independence.   Follow Up Recommendations  SNF;Supervision/Assistance - 24 hour     Equipment Recommendations  None recommended by PT    Recommendations for Other Services       Precautions / Restrictions Precautions Precautions: Fall Restrictions Weight Bearing Restrictions: Yes RLE Weight Bearing: Weight bearing as tolerated    Mobility  Bed Mobility               General bed mobility comments: up in chair upon arrival  Transfers Overall transfer level: Needs assistance Equipment used: Rolling walker (2 wheeled) Transfers: Sit to/from Stand Sit to Stand: Min guard         General transfer comment: no cues needed for hand placement  Ambulation/Gait Ambulation/Gait assistance: Min guard Ambulation Distance (Feet): 50 Feet Assistive device: Rolling walker (2 wheeled) Gait Pattern/deviations: Step-through pattern;Decreased stance time - left;Decreased weight shift to right Gait velocity: decreased   General Gait Details: uneven strides and decreased weightbearing on rt LE, no gross loss of balance.    Stairs            Wheelchair Mobility    Modified Rankin (Stroke Patients Only)       Balance Overall balance assessment: Needs assistance Sitting-balance support: No upper extremity supported Sitting balance-Leahy Scale: Good     Standing balance support: Bilateral upper  extremity supported Standing balance-Leahy Scale: Poor Standing balance comment: using rw                    Cognition Arousal/Alertness: Awake/alert Behavior During Therapy: WFL for tasks assessed/performed Overall Cognitive Status: Within Functional Limits for tasks assessed                      Exercises      General Comments        Pertinent Vitals/Pain Pain Assessment: No/denies pain (Rt hip just really sore)    Home Living                      Prior Function            PT Goals (current goals can now be found in the care plan section) Acute Rehab PT Goals Patient Stated Goal: Be able to walk like normal PT Goal Formulation: With patient Time For Goal Achievement: 05/07/15 Potential to Achieve Goals: Fair Progress towards PT goals: Progressing toward goals    Frequency  Min 5X/week    PT Plan Current plan remains appropriate    Co-evaluation             End of Session Equipment Utilized During Treatment: Gait belt Activity Tolerance: Patient limited by fatigue Patient left: in chair;with call bell/phone within reach;Other (comment) (patient verbalizes understanding of up with assist only)     Time: 9563-8756 PT Time Calculation (min) (ACUTE ONLY): 20 min  Charges:  $Gait Training: 8-22 mins  G Codes:      Cassell Clement, PT, CSCS Pager [phone] Office [phone]  04/26/2015, 9:18 AM

## 2015-04-26 NOTE — Discharge Summary (Signed)
Physician Discharge Summary  Bruce Webster DXA:[id number] DOB: 1923/02/12 DOA: 04/22/2015  PCP: Asencion Noble, MD  Admit date: 04/22/2015 Discharge date: 04/26/2015  Time spent: 35 minutes  Recommendations for Outpatient Follow-up:  1. Discharge to skilled nursing facility ([facility]). Allow weightbearing as tolerated and continuous physical therapy. 2. Follow-up with orthopedics Dr. Marlou Sa in 2 weeks. 3. Patient will complete a seven-day course of antibiotic (Keflex) for UTI on 10/26  Discharge Diagnoses:  Principal Problem:   Intertrochanteric fracture of right hip ([place])  Active Problems:   HTN (hypertension)   Right hip pain   Hip fracture ([facility])   Acute blood loss anemia   UTI (urinary tract infection)   Acute kidney injury ([place])   Malnutrition of moderate degree   Discharge Condition: Fair  Diet recommendation: Regular  Filed Weights   04/25/15 1300  Weight: 54.432 kg (120 lb)    History of present illness:  Please refer to admission H&P for details, in brief,81 year old male living independently with history of hypertension and bladder cancer who had a mechanical fall at home 1 week back was diagnosed in the ED initially with a nondisplaced greater trochanter fracture and was discharged home with recommendations for partial weightbearing and follow-up as outpatient by orthopedic surgery. He was seen in the office on 10/20. He was unable to be in weight and had progressive debilitating hip pain limiting his mobility. He had an MRI done which showed right intertrochanteric hip fracture. Patient admitted to hospitalist service and taken to or for IM hip screw fixation. Tolerated surgery well.  Hospital Course:  Right hip fracture Secondary to mechanical fall. Patient underwent IM nail placement. Pain control with when necessary Vicodin and Robaxin. PT recommends skilled nursing facility. He has been tolerating physical therapy well in the hospital. Patient will be  discharged on vicodin as needed for pain. Full dose aspirin for DVT prophylaxis. (At least 4 weeks). Weightbearing and ambulation with PT as tolerated. He should follow-up with Dr. Marlou Sa in 2 weeks.  Essential hypertension Irritable blood pressure. I have increased dose of amlodipine. Continue lisinopril.  Acute kidney injury No baseline available. Hold lisinopril. Continue gentle hydration.  ? UTI Has positive UA and difficult insertion of Foley catheter on admission. Based on IV Rocephin. Will discharge him on oral Keflex to complete a seven-day course of antibiotic.  Acute blood loss anemia post op. Stable. Follow-up as outpatient.  Moderate protein calorie malnutrition Added supplement.      Code Status: Full code Family Communication: None at bedside Disposition Plan: Skilled nursing facility    Consultants:  Dr Marlou Sa ( ortho)  Procedures:  IM nail rt hip  Antibiotics:  IV rocephin   Discharge Exam: Filed Vitals:   04/26/15 0723  BP: 152/57  Pulse: 52  Temp: 98.1 F (36.7 C)  Resp: 18     General: Elderly male in no acute distress, hard of hearing  HEENT: No pallor , moist oral mucosa  Chest: Clear to auscultation bilaterally   CVS: Normal S1 and S2, no murmurs  GI: Soft, nondistended, nontender,   Musculoskeletal: Dressing over her right hip , no edema  CNS: Hard of hearing, alert and oriented  Discharge Instructions   Discharge Instructions    Call MD / Call 911    Complete by:  As directed   If you experience chest pain or shortness of breath, CALL 911 and be transported to the hospital emergency room.  If you develope a fever above 101 F, pus (white drainage)  or increased drainage or redness at the wound, or calf pain, call your surgeon's office.     Constipation Prevention    Complete by:  As directed   Drink plenty of fluids.  Prune juice may be helpful.  You may use a stool softener, such as Colace (over the counter) 100 mg  twice a day.  Use MiraLax (over the counter) for constipation as needed.     Diet - low sodium heart healthy    Complete by:  As directed      Increase activity slowly as tolerated    Complete by:  As directed      Weight bearing as tolerated    Complete by:  As directed           Current Discharge Medication List    START taking these medications   Details  aspirin EC 325 MG tablet Take 1 tablet (325 mg total) by mouth daily. Qty: 30 tablet, Refills: 0    cephALEXin (KEFLEX) 500 MG capsule Take 1 capsule (500 mg total) by mouth 2 (two) times daily. Qty: 4 capsule, Refills: 0    feeding supplement, ENSURE ENLIVE, (ENSURE ENLIVE) LIQD Take 237 mLs by mouth 2 (two) times daily between meals. Qty: 237 mL, Refills: 12    HYDROcodone-acetaminophen (NORCO) 5-325 MG tablet Take 1-2 tablets by mouth every 6 (six) hours as needed for moderate pain. Qty: 60 tablet, Refills: 0      CONTINUE these medications which have CHANGED   Details  amLODipine (NORVASC) 10 MG tablet Take 1 tablet (10 mg total) by mouth daily. Qty: 30 tablet, Refills: 0      CONTINUE these medications which have NOT CHANGED   Details  cholecalciferol (VITAMIN D) 1000 UNITS tablet Take 1,000 Units by mouth daily.    lisinopril (PRINIVIL,ZESTRIL) 10 MG tablet Take 10 mg by mouth daily.    Multiple Vitamin (MULTIVITAMIN WITH MINERALS) TABS tablet Take 1 tablet by mouth daily.      STOP taking these medications     oxyCODONE-acetaminophen (PERCOCET/ROXICET) 5-325 MG tablet        No Known Allergies Follow-up Information    Follow up with Meredith Pel, MD. Call in 2 weeks.   Specialty:  Orthopedic Surgery   Why:  MD at [facility]   Contact information:   [place] [address] [phone]        The results of significant diagnostics from this hospitalization (including imaging, microbiology, ancillary and laboratory) are listed below for reference.    Significant Diagnostic  Studies: Dg Skull 1-3 Views  04/13/2015  CLINICAL DATA:  81 year old male with previous year surgery in the 1990s, unsure if metal was implanted. Planned MRI. Initial encounter. EXAM: SKULL - 1-3 VIEW COMPARISON:  None. FINDINGS: No radiopaque or metallic foreign body identified about the skull other than dental hardware. Bone mineralization is within normal limits. Paranasal sinuses and mastoids appear clear and symmetrically pneumatized. Calcified right carotid atherosclerosis in the neck. IMPRESSION: No metallic foreign body identified about the skull to preclude MRI imaging. Electronically Signed   By: Genevie Ann M.D.   On: 04/13/2015 18:19   Dg Pelvis 1-2 Views  04/13/2015  CLINICAL DATA:  Status post fall.  Right hip pain. EXAM: PELVIS - 1-2 VIEW COMPARISON:  None. FINDINGS: There is generalized osteopenia. There is no acute fracture dislocation. 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 IMPRESSION: No acute osseous injury of the pelvis. Electronically Signed   By: Elbert Ewings  Patel   On: 04/13/2015 17:17   Mr Pelvis Wo Contrast  04/13/2015  CLINICAL DATA:  Right hip pain after fall.  Unable to bear weight. EXAM: MRI PELVIS WITHOUT CONTRAST TECHNIQUE: Multiplanar multisequence MR imaging of the pelvis was performed. No intravenous contrast was administered. COMPARISON:  None. FINDINGS: Bone Marrow edema within the right greater trochanter with associated serpiginous linear signal abnormality most consistent with a nondisplaced fracture. Fracture cleft extends into the mid intertrochanteric aspect of the right hip, but does not extend through the lesser trochanter. There is no left hip fracture or dislocation. There is no avascular necrosis. The sacrum and sacroiliac joints are normal. Alignment Normal. No subluxation. Dysplasia None. Joint effusion None. Labrum Normal. No labral tear. Cartilage Femoral cartilage: Right femoral head cartilage loss. Acetabular cartilage: Partial thickness cartilage loss  the right acetabulum. Capsule and ligaments Normal. Muscles and Tendons Gluteals: Severe at edema within the right gluteus medius consistent with high-grade partial tear. Flexors: Normal. Extensors: Normal. Abductors: Normal. Adductors: Normal. Rotators: Normal. Hamstrings: Normal. Other: Muscle edema within the right quadratus femoris muscle likely reflecting muscle strain. Other Findings None Viscera No pelvic free fluid. Diverticulosis of the sigmoid colon. Multiple bladder diverticula with the largest along the right bladder base. Enlarged prostate gland. IMPRESSION: 1. Marrow edema within the right greater trochanter with associated serpiginous linear signal abnormality most consistent with a nondisplaced fracture. Fracture cleft extends into the mid intertrochanteric aspect of the right hip, but does not extend through the lesser trochanter. 2. Severe at edema within the right gluteus medius consistent with high-grade partial tear. Right quadrant is fem wrist muscle strain. Electronically Signed   By: Kathreen Devoid   On: 04/13/2015 18:58   Pelvis Portable  04/22/2015  CLINICAL DATA:  Status post right femoral ORIF EXAM: PORTABLE PELVIS 1-2 VIEWS COMPARISON:  04/13/2015 FINDINGS: Medullary rod is noted with 2 fixation screws traversing the femoral neck. No acute abnormality is noted. No soft tissue changes are seen. IMPRESSION: Status post ORIF of proximal right femoral fracture Electronically Signed   By: Inez Catalina M.D.   On: 04/22/2015 23:26   Chest Portable 1 View  04/22/2015  CLINICAL DATA:  Preoperative evaluation for upcoming surgery EXAM: PORTABLE CHEST - 1 VIEW COMPARISON:  05/17/2005 FINDINGS: Cardiac shadow is stable. The lungs are well aerated bilaterally. The previously seen nodular density in the right mid lung is less well appreciated on the current exam. No new focal abnormality is seen. IMPRESSION: No acute abnormality noted. Electronically Signed   By: Inez Catalina M.D.   On:  04/22/2015 16:45   Dg Hip Operative Unilat With Pelvis Right  04/22/2015  CLINICAL DATA:  Intertrochanteric femur fracture fixation. EXAM: OPERATIVE RIGHT HIP (WITH PELVIS IF PERFORMED) 3 VIEWS TECHNIQUE: Fluoroscopic spot image(s) were submitted for interpretation post-operatively. COMPARISON:  Radiographs and MRI 04/13/2015. FINDINGS: Three spot fluoroscopic images demonstrate open reduction and internal fixation of the intertrochanteric femur fracture with a dynamic screw and intramedullary rod. The hardware appears well positioned. No complications identified. IMPRESSION: Right femoral ORIF without demonstrated complication. Electronically Signed   By: Richardean Sale M.D.   On: 04/22/2015 21:18   Dg Femur, Min 2 Views Right  04/13/2015  CLINICAL DATA:  Right hip and buttock pain. EXAM: RIGHT FEMUR 2 VIEWS COMPARISON:  None. FINDINGS: There is no evidence of fracture or other focal bone lesions. Soft tissues are unremarkable. There is peripheral vascular atherosclerotic disease. IMPRESSION: No acute osseous injury of the right femur. Electronically Signed  By: Kathreen Devoid   On: 04/13/2015 17:18    Microbiology: Recent Results (from the past 240 hour(s))  Surgical pcr screen     Status: None   Collection Time: 04/22/15  7:27 PM  Result Value Ref Range Status   MRSA, PCR NEGATIVE NEGATIVE Final   Staphylococcus aureus NEGATIVE NEGATIVE Final    Comment:        The Xpert SA Assay (FDA approved for NASAL specimens in patients over 58 years of age), is one component of a comprehensive surveillance program.  Test performance has been validated by [facility] for patients greater than or equal to 56 year old. It is not intended to diagnose infection nor to guide or monitor treatment.      Labs: Basic Metabolic Panel:  Recent Labs Lab 04/22/15 1655 04/23/15 0443 04/24/15 0345 04/25/15 0456  NA 140 139 136 135  K 4.3 4.2 4.3 4.5  CL 106 104 102 102  CO2 '24 27 26 24  '$ GLUCOSE  91 108* 108* 101*  BUN 30* 22* 22* 21*  CREATININE 1.43* 1.37* 1.38* 1.17  CALCIUM 9.2 8.9 8.7* 8.7*   Liver Function Tests:  Recent Labs Lab 04/22/15 1655  AST 35  ALT 42  ALKPHOS 135*  BILITOT 0.5  PROT 6.4*  ALBUMIN 3.3*   No results for input(s): LIPASE, AMYLASE in the last 168 hours. No results for input(s): AMMONIA in the last 168 hours. CBC:  Recent Labs Lab 04/22/15 1655 04/23/15 0443 04/24/15 0345 04/25/15 0456  WBC 9.7 12.3* 12.1* 12.3*  HGB 10.5* 9.7* 9.5* 9.3*  HCT 33.0* 30.3* 28.3* 28.4*  MCV 97.3 97.1 95.6 95.9  PLT 274 253 237 260   Cardiac Enzymes: No results for input(s): CKTOTAL, CKMB, CKMBINDEX, TROPONINI in the last 168 hours. BNP: BNP (last 3 results) No results for input(s): BNP in the last 8760 hours.  ProBNP (last 3 results) No results for input(s): PROBNP in the last 8760 hours.  CBG: No results for input(s): GLUCAP in the last 168 hours.     SignedLouellen Molder  [facility] 04/26/2015, 9:13 AM

## 2015-04-26 NOTE — Clinical Social Work Note (Signed)
Patient to be d/c'ed today to [facility].  Patient and family agreeable to plans will transport via ems RN to call report.  Evette Cristal, MSW, [place]

## 2015-04-26 NOTE — Progress Notes (Signed)
Rept called to [facility] at [facility]. Pt transferred via stretcher via [facility] without incident. No change in AM assessment.

## 2015-04-28 DIAGNOSIS — M84359A Stress fracture, hip, unspecified, initial encounter for fracture: Secondary | ICD-10-CM | POA: Diagnosis not present

## 2015-05-13 DIAGNOSIS — S72001D Fracture of unspecified part of neck of right femur, subsequent encounter for closed fracture with routine healing: Secondary | ICD-10-CM | POA: Diagnosis not present

## 2015-05-14 ENCOUNTER — Inpatient Hospital Stay (HOSPITAL_COMMUNITY): Admit: 2015-05-14 | Payer: Self-pay

## 2015-05-20 DIAGNOSIS — S72144D Nondisplaced intertrochanteric fracture of right femur, subsequent encounter for closed fracture with routine healing: Secondary | ICD-10-CM | POA: Diagnosis not present

## 2015-06-16 DIAGNOSIS — M545 Low back pain: Secondary | ICD-10-CM | POA: Diagnosis not present

## 2015-06-16 DIAGNOSIS — S72001D Fracture of unspecified part of neck of right femur, subsequent encounter for closed fracture with routine healing: Secondary | ICD-10-CM | POA: Diagnosis not present

## 2015-06-17 ENCOUNTER — Other Ambulatory Visit (HOSPITAL_COMMUNITY): Payer: Self-pay | Admitting: Internal Medicine

## 2015-06-17 DIAGNOSIS — T148XXA Other injury of unspecified body region, initial encounter: Secondary | ICD-10-CM

## 2015-06-23 ENCOUNTER — Ambulatory Visit (HOSPITAL_COMMUNITY)
Admission: RE | Admit: 2015-06-23 | Discharge: 2015-06-23 | Disposition: A | Discharge status: Home / Self Care | Payer: Medicare Other | Source: Ambulatory Visit | Attending: Internal Medicine | Admitting: Internal Medicine

## 2015-06-23 DIAGNOSIS — F17200 Nicotine dependence, unspecified, uncomplicated: Secondary | ICD-10-CM | POA: Insufficient documentation

## 2015-06-23 DIAGNOSIS — M810 Age-related osteoporosis without current pathological fracture: Secondary | ICD-10-CM | POA: Diagnosis not present

## 2015-06-23 DIAGNOSIS — T148 Other injury of unspecified body region: Secondary | ICD-10-CM | POA: Diagnosis not present

## 2015-06-23 DIAGNOSIS — X58XXXD Exposure to other specified factors, subsequent encounter: Secondary | ICD-10-CM | POA: Diagnosis not present

## 2015-06-23 DIAGNOSIS — S72001D Fracture of unspecified part of neck of right femur, subsequent encounter for closed fracture with routine healing: Secondary | ICD-10-CM | POA: Insufficient documentation

## 2015-06-23 DIAGNOSIS — T148XXA Other injury of unspecified body region, initial encounter: Secondary | ICD-10-CM

## 2015-07-20 DIAGNOSIS — I10 Essential (primary) hypertension: Secondary | ICD-10-CM | POA: Diagnosis not present

## 2015-07-20 DIAGNOSIS — M810 Age-related osteoporosis without current pathological fracture: Secondary | ICD-10-CM | POA: Diagnosis not present

## 2015-07-20 DIAGNOSIS — Z6820 Body mass index (BMI) 20.0-20.9, adult: Secondary | ICD-10-CM | POA: Diagnosis not present

## 2015-11-22 DIAGNOSIS — H6122 Impacted cerumen, left ear: Secondary | ICD-10-CM | POA: Diagnosis not present

## 2015-11-22 DIAGNOSIS — H7292 Unspecified perforation of tympanic membrane, left ear: Secondary | ICD-10-CM | POA: Diagnosis not present

## 2016-04-27 ENCOUNTER — Encounter (HOSPITAL_COMMUNITY): Payer: Self-pay

## 2016-04-27 ENCOUNTER — Emergency Department (HOSPITAL_COMMUNITY): Payer: Medicare Other

## 2016-04-27 ENCOUNTER — Emergency Department (HOSPITAL_COMMUNITY)
Admission: EM | Admit: 2016-04-27 | Discharge: 2016-04-27 | Disposition: A | Discharge status: Home / Self Care | Payer: Medicare Other | Attending: Emergency Medicine | Admitting: Emergency Medicine

## 2016-04-27 DIAGNOSIS — I10 Essential (primary) hypertension: Secondary | ICD-10-CM | POA: Diagnosis not present

## 2016-04-27 DIAGNOSIS — R1032 Left lower quadrant pain: Secondary | ICD-10-CM | POA: Diagnosis not present

## 2016-04-27 DIAGNOSIS — Z87891 Personal history of nicotine dependence: Secondary | ICD-10-CM | POA: Diagnosis not present

## 2016-04-27 DIAGNOSIS — R109 Unspecified abdominal pain: Secondary | ICD-10-CM | POA: Diagnosis present

## 2016-04-27 DIAGNOSIS — K529 Noninfective gastroenteritis and colitis, unspecified: Secondary | ICD-10-CM

## 2016-04-27 DIAGNOSIS — Z79899 Other long term (current) drug therapy: Secondary | ICD-10-CM | POA: Diagnosis not present

## 2016-04-27 DIAGNOSIS — Z7982 Long term (current) use of aspirin: Secondary | ICD-10-CM | POA: Insufficient documentation

## 2016-04-27 DIAGNOSIS — N400 Enlarged prostate without lower urinary tract symptoms: Secondary | ICD-10-CM | POA: Diagnosis not present

## 2016-04-27 LAB — CBC WITH DIFFERENTIAL/PLATELET
Basophils Absolute: 0 10*3/uL (ref 0.0–0.1)
Basophils Relative: 0 %
EOS ABS: 0.3 10*3/uL (ref 0.0–0.7)
Eosinophils Relative: 3 %
HEMATOCRIT: 40.6 % (ref 39.0–52.0)
HEMOGLOBIN: 13.1 g/dL (ref 13.0–17.0)
LYMPHS ABS: 2.5 10*3/uL (ref 0.7–4.0)
Lymphocytes Relative: 24 %
MCH: 31.8 pg (ref 26.0–34.0)
MCHC: 32.3 g/dL (ref 30.0–36.0)
MCV: 98.5 fL (ref 78.0–100.0)
MONOS PCT: 6 %
Monocytes Absolute: 0.6 10*3/uL (ref 0.1–1.0)
NEUTROS ABS: 7.3 10*3/uL (ref 1.7–7.7)
NEUTROS PCT: 67 %
Platelets: 243 10*3/uL (ref 150–400)
RBC: 4.12 MIL/uL — AB (ref 4.22–5.81)
RDW: 14.2 % (ref 11.5–15.5)
WBC: 10.7 10*3/uL — AB (ref 4.0–10.5)

## 2016-04-27 LAB — COMPREHENSIVE METABOLIC PANEL
ALBUMIN: 4 g/dL (ref 3.5–5.0)
ALK PHOS: 86 U/L (ref 38–126)
ALT: 12 U/L — AB (ref 17–63)
AST: 12 U/L — ABNORMAL LOW (ref 15–41)
Anion gap: 4 — ABNORMAL LOW (ref 5–15)
BILIRUBIN TOTAL: 0.7 mg/dL (ref 0.3–1.2)
BUN: 24 mg/dL — ABNORMAL HIGH (ref 6–20)
CALCIUM: 9.1 mg/dL (ref 8.9–10.3)
CO2: 27 mmol/L (ref 22–32)
CREATININE: 1.19 mg/dL (ref 0.61–1.24)
Chloride: 104 mmol/L (ref 101–111)
GFR calc non Af Amer: 52 mL/min — ABNORMAL LOW (ref >60)
GFR, EST AFRICAN AMERICAN: 60 mL/min — AB (ref >60)
GLUCOSE: 98 mg/dL (ref 65–99)
Potassium: 5 mmol/L (ref 3.5–5.1)
SODIUM: 135 mmol/L (ref 135–145)
TOTAL PROTEIN: 7.1 g/dL (ref 6.5–8.1)

## 2016-04-27 LAB — URINALYSIS, ROUTINE W REFLEX MICROSCOPIC
BILIRUBIN URINE: NEGATIVE
GLUCOSE, UA: NEGATIVE mg/dL
HGB URINE DIPSTICK: NEGATIVE
KETONES UR: NEGATIVE mg/dL
Nitrite: POSITIVE — AB
PH: 6 (ref 5.0–8.0)
PROTEIN: NEGATIVE mg/dL
Specific Gravity, Urine: 1.01 (ref 1.005–1.030)

## 2016-04-27 LAB — URINE MICROSCOPIC-ADD ON

## 2016-04-27 MED ORDER — METRONIDAZOLE 500 MG PO TABS: 500.0000 mg | ORAL_TABLET | Freq: Three times a day (TID) | ORAL | 0 refills | Status: DC

## 2016-04-27 MED ORDER — CIPROFLOXACIN HCL 250 MG PO TABS
500.0000 mg | ORAL_TABLET | Freq: Once | ORAL | Status: AC
  Administered 2016-04-27: 500 mg via ORAL
  Filled 2016-04-27: qty 2

## 2016-04-27 MED ORDER — METRONIDAZOLE 500 MG PO TABS
500.0000 mg | ORAL_TABLET | Freq: Once | ORAL | Status: AC
  Administered 2016-04-27: 500 mg via ORAL
  Filled 2016-04-27: qty 1

## 2016-04-27 MED ORDER — CIPROFLOXACIN HCL 500 MG PO TABS: 500.0000 mg | ORAL_TABLET | Freq: Two times a day (BID) | ORAL | 0 refills | Status: DC

## 2016-04-27 MED ORDER — IOPAMIDOL (ISOVUE-300) INJECTION 61%
100.0000 mL | Freq: Once | INTRAVENOUS | Status: AC | PRN
  Administered 2016-04-27: 100 mL via INTRAVENOUS

## 2016-04-27 MED ORDER — IOPAMIDOL (ISOVUE-300) INJECTION 61%
INTRAVENOUS | Status: AC
  Filled 2016-04-27: qty 30

## 2016-04-27 NOTE — ED Triage Notes (Signed)
Complain of Right lower quad pain that started a few hours ago. States he took a tylenol and it helped some but when he pushes on the area it is real tender. Denies other symptoms.

## 2016-04-27 NOTE — ED Notes (Signed)
To ct

## 2016-04-27 NOTE — ED Provider Notes (Signed)
[facility] DEPT Provider Note   CSN: [id number] Arrival date & time: 04/27/16  1731     History   Chief Complaint Chief Complaint  Patient presents with  . Abdominal Pain    HPI Bruce Webster is a 82 y.o. male.  HPI Complains of right flank pain gradual onset nonradiating onset 10 AM today, gradually. Pain is worse with changing positions improved with remaining still. He treated himself with Tylenol prior to coming here discomfort is minimal presently. Last bowel movement proximally 7 AM today, normal. No nausea or vomiting no anorexia. No other associated symptoms a sensation discomfort is minimal at present Past Medical History:  Diagnosis Date  . Acute blood loss anemia 04/23/2015  . Acute kidney injury ([place]) 04/23/2015  . Cancer [facility])    Bladder  . Hypertension     Patient Active Problem List   Diagnosis Date Noted  . Essential hypertension, benign   . Malnutrition of moderate degree 04/24/2015  . Acute blood loss anemia 04/23/2015  . UTI (urinary tract infection) 04/23/2015  . Acute kidney injury ([place]) 04/23/2015  . Intertrochanteric fracture of right hip ([place]) 04/22/2015  . HTN (hypertension) 04/22/2015  . Right hip pain 04/22/2015  . Hip fracture ([place]) 04/22/2015  Kidney stones  Past Surgical History:  Procedure Laterality Date  . CHOLECYSTECTOMY    . EYE SURGERY     [employer]  . INTRAMEDULLARY (IM) NAIL INTERTROCHANTERIC Right 04/22/2015   Procedure: INTRAMEDULLARY (IM) NAIL RIGHT HIP;  Surgeon: Meredith Pel, MD;  Location: [facility];  Service: Orthopedics;  Laterality: Right;  . KIDNEY STONE SURGERY    . TONSILLECTOMY         Home Medications    Prior to Admission medications   Medication Sig Start Date End Date Taking? Authorizing Provider  amLODipine (NORVASC) 10 MG tablet Take 1 tablet (10 mg total) by mouth daily. 04/26/15   Nishant Dhungel, MD  aspirin EC 325 MG tablet Take 1 tablet (325 mg total) by mouth daily. 04/23/15   Meredith Pel, MD  cholecalciferol (VITAMIN D) 1000 UNITS tablet Take 1,000 Units by mouth daily.    Historical Provider, MD  feeding supplement, ENSURE ENLIVE, (ENSURE ENLIVE) LIQD Take 237 mLs by mouth 2 (two) times daily between meals. 04/26/15   Nishant Dhungel, MD  HYDROcodone-acetaminophen (NORCO) 5-325 MG tablet Take 1-2 tablets by mouth every 6 (six) hours as needed for moderate pain. 04/23/15   Meredith Pel, MD  lisinopril (PRINIVIL,ZESTRIL) 10 MG tablet Take 10 mg by mouth daily. 04/05/15   Historical Provider, MD  Multiple Vitamin (MULTIVITAMIN WITH MINERALS) TABS tablet Take 1 tablet by mouth daily.    Historical Provider, MD    Family History No family history on file.  Social History Social History  Substance Use Topics  . Smoking status: Former Smoker    Packs/day: 0.25    Types: Cigarettes, Cigars    Quit date: 02/01/2015  . Smokeless tobacco: Never Used  . Alcohol use No   Current smoker  Allergies   Review of patient's allergies indicates no known allergies.   Review of Systems Review of Systems  Constitutional: Negative.   HENT: Negative.   Respiratory: Negative.   Cardiovascular: Negative.   Gastrointestinal: Negative.   Genitourinary: Positive for flank pain.  Skin: Negative.   Neurological: Negative.   Psychiatric/Behavioral: Negative.   All other systems reviewed and are negative.    Physical Exam Updated Vital Signs BP 161/69 (BP Location: Left Arm)  Pulse (!) 59   Temp 98.1 F (36.7 C) (Oral)   Resp 18   Ht 6' (1.829 m)   Wt 120 lb (54.4 kg)   SpO2 100%   BMI 16.27 kg/m   Physical Exam  Constitutional: He is oriented to person, place, and time. He appears well-developed and well-nourished. No distress.  HENT:  Head: Normocephalic and atraumatic.  poOr dentition  Eyes: Conjunctivae are normal. Pupils are equal, round, and reactive to light.  Neck: Neck supple. No tracheal deviation present. No thyromegaly present.    Cardiovascular: Normal rate and regular rhythm.   No murmur heard. Pulmonary/Chest: Effort normal and breath sounds normal.  Abdominal: Soft. Bowel sounds are normal. He exhibits no distension. There is no tenderness.  Right upper quadrant surgical scar  Genitourinary: Penis normal.  Genitourinary Comments: Scrotum normal. Right flank tenderness  Musculoskeletal: Normal range of motion. He exhibits no edema or tenderness.  Neurological: He is alert and oriented to person, place, and time. Coordination normal.  Skin: Skin is warm and dry. No rash noted.  Psychiatric: He has a normal mood and affect.  Nursing note and vitals reviewed.    ED Treatments / Results  Labs (all labs ordered are listed, but only abnormal results are displayed) Labs Reviewed  CBC WITH DIFFERENTIAL/PLATELET  COMPREHENSIVE METABOLIC PANEL  URINALYSIS, ROUTINE W REFLEX MICROSCOPIC (NOT AT [facility])    EKG  EKG Interpretation None       Radiology No results found.  Procedures Procedures (including critical care time)  Medications Ordered in ED Medications - No data to display Declines pain medicine Results for orders placed or performed during the hospital encounter of 04/27/16  CBC with Differential  Result Value Ref Range   WBC 10.7 (H) 4.0 - 10.5 K/uL   RBC 4.12 (L) 4.22 - 5.81 MIL/uL   Hemoglobin 13.1 13.0 - 17.0 g/dL   HCT 40.6 39.0 - 52.0 %   MCV 98.5 78.0 - 100.0 fL   MCH 31.8 26.0 - 34.0 pg   MCHC 32.3 30.0 - 36.0 g/dL   RDW 14.2 11.5 - 15.5 %   Platelets 243 150 - 400 K/uL   Neutrophils Relative % 67 %   Neutro Abs 7.3 1.7 - 7.7 K/uL   Lymphocytes Relative 24 %   Lymphs Abs 2.5 0.7 - 4.0 K/uL   Monocytes Relative 6 %   Monocytes Absolute 0.6 0.1 - 1.0 K/uL   Eosinophils Relative 3 %   Eosinophils Absolute 0.3 0.0 - 0.7 K/uL   Basophils Relative 0 %   Basophils Absolute 0.0 0.0 - 0.1 K/uL  Comprehensive metabolic panel  Result Value Ref Range   Sodium 135 135 - 145 mmol/L    Potassium 5.0 3.5 - 5.1 mmol/L   Chloride 104 101 - 111 mmol/L   CO2 27 22 - 32 mmol/L   Glucose, Bld 98 65 - 99 mg/dL   BUN 24 (H) 6 - 20 mg/dL   Creatinine, Ser 1.19 0.61 - 1.24 mg/dL   Calcium 9.1 8.9 - 10.3 mg/dL   Total Protein 7.1 6.5 - 8.1 g/dL   Albumin 4.0 3.5 - 5.0 g/dL   AST 12 (L) 15 - 41 U/L   ALT 12 (L) 17 - 63 U/L   Alkaline Phosphatase 86 38 - 126 U/L   Total Bilirubin 0.7 0.3 - 1.2 mg/dL   GFR calc non Af Amer 52 (L) >60 mL/min   GFR calc Af Amer 60 (L) >60 mL/min   Anion  gap 4 (L) 5 - 15  Urinalysis, Routine w reflex microscopic (not at [facility])  Result Value Ref Range   Color, Urine YELLOW YELLOW   APPearance CLEAR CLEAR   Specific Gravity, Urine 1.010 1.005 - 1.030   pH 6.0 5.0 - 8.0   Glucose, UA NEGATIVE NEGATIVE mg/dL   Hgb urine dipstick NEGATIVE NEGATIVE   Bilirubin Urine NEGATIVE NEGATIVE   Ketones, ur NEGATIVE NEGATIVE mg/dL   Protein, ur NEGATIVE NEGATIVE mg/dL   Nitrite POSITIVE (A) NEGATIVE   Leukocytes, UA TRACE (A) NEGATIVE  Urine microscopic-add on  Result Value Ref Range   Squamous Epithelial / LPF 0-5 (A) NONE SEEN   WBC, UA 6-30 0 - 5 WBC/hpf   RBC / HPF 0-5 0 - 5 RBC/hpf   Bacteria, UA MANY (A) NONE SEEN   Ct Abdomen Pelvis W Contrast  Addendum Date: 04/27/2016   ADDENDUM REPORT: 04/27/2016 22:27 ADDENDUM: There is a transcription error in Impression point #1. The first sentence should read, "Focal loop of distal ileum in the right lower QUADRANT with wall hyperemia and surrounding inflammatory change, suggesting an acute inflammatory or infectious enteritis." Electronically Signed   By: Ulyses Jarred M.D.   On: 04/27/2016 22:27   Result Date: 04/27/2016 CLINICAL DATA:  Right lower quadrant pain EXAM: CT ABDOMEN AND PELVIS WITH CONTRAST TECHNIQUE: Multidetector CT imaging of the abdomen and pelvis was performed using the standard protocol following bolus administration of intravenous contrast. CONTRAST:  120m ISOVUE-300 IOPAMIDOL  (ISOVUE-300) INJECTION 61% COMPARISON:  None. FINDINGS: Lower chest: No pulmonary nodules. No visible pleural or pericardial effusion. Hepatobiliary: There are multiple scattered hypo attenuating hepatic lesions, the largest of which is at the hepatic dome and measures up to 1.5 cm. Attenuation values are consistent with hepatic cysts. There are multiple calcified granulomata within the liver. The gallbladder is surgically absent. There is intra and extrahepatic biliary dilatation, compatible with a postcholecystectomy status. Pancreas: Normal pancreatic contours and enhancement. No peripancreatic fluid collection or pancreatic ductal dilatation. Spleen: Normal. Adrenals/Urinary Tract: Normal adrenal glands. Severe right renal atrophy with a calculus measuring up to 3 mm near the lower pole. Multiple bilateral renal cysts. The left renal pelvis is mildly dilated. No obstructing ureteral lesion is identified. There are bilateral urinary bladder diverticula with layering calcium in the right diverticulum. Stomach/Bowel: Within the right lower quadrant, there is a loop of distal ileum that shows hyper enhancement of its wall with mild associated wall thickening and small amount of nearby free fluid. The more proximal small bowel is mildly dilated. There is rectosigmoid diverticulosis without evidence of acute inflammation. Normal appendix. Vascular/Lymphatic: There is extensive aortic atherosclerotic plaque and calcification. The major aortic branches are patent. No abdominal or pelvic adenopathy. Reproductive: The prostate is enlarged, measuring 5.5 x 5.7 by 5.7 cm. Musculoskeletal: Right femoral prosthesis is noted. There is multilevel lumbar facet arthrosis and osteophytosis. No advanced bony spinal canal stenosis. No lytic or blastic osseous lesions. Normal visualized extrathoracic and extraperitoneal soft tissues. Other: No contributory non-categorized findings. IMPRESSION: 1. Focal loop of distal ileum in the  right lower carotid front with wall hyperemia and surrounding inflammatory change, suggesting an acute inflammatory or infectious enteritis. Small amount of adjacent free fluid but no abscess formation or evidence perforation. 2. Enlarged prostate with bilateral bladder diverticula, suggesting a degree of bladder outlet obstruction. 3. Rectosigmoid diverticulosis without acute diverticulitis. Electronically Signed: By: KUlyses JarredM.D. On: 04/27/2016 22:12   Initial Impression / Assessment and Plan / ED Course  I have reviewed the triage vital signs and the nursing notes.  Pertinent labs & imaging results that were available during my care of the patient were reviewed by me and considered in my medical decision making (see chart for details).  Clinical Course    10:40 PM patient feels well and feels ready to go home. In light of no dilatation of bowel loops no abscess patient feels well. No fever. I feel he can be treated as outpatient. Plan prescriptions Flagyl Cipro. Follow-up with Dr. Willey Blade if not better by next week or return if condition worsens. Urine sent for culture. Blood pressure recheck one to 2 weeks  Final Clinical Impressions(s) / ED Diagnoses  Diagnosis #1colitis #2 elevated blood pressure Final diagnoses:  None    New Prescriptions New Prescriptions   No medications on file     Orlie Dakin, MD 04/27/16 2252

## 2016-04-27 NOTE — Discharge Instructions (Signed)
Take the medications prescribed as directed. See Dr. Willey Blade in the office if not feeling better by next week. Return if you develop fever, vomiting or feel worse for any reason. Get your blood pressure recheck at Dr. Ria Comment office within the next one or 2 weeks. Today's was elevated at 164/74.

## 2016-04-27 NOTE — ED Notes (Signed)
Pt reports rlq pain that started this morning but has gotten worse throughout the day.  Denies n/v/d or urinary symptoms.  LBM was this morning and was normal per pt.  Pt tender over RLQ.

## 2016-04-29 LAB — URINE CULTURE

## 2016-05-04 DIAGNOSIS — K529 Noninfective gastroenteritis and colitis, unspecified: Secondary | ICD-10-CM | POA: Diagnosis not present

## 2016-05-04 DIAGNOSIS — I10 Essential (primary) hypertension: Secondary | ICD-10-CM | POA: Diagnosis not present

## 2016-09-19 DIAGNOSIS — H6123 Impacted cerumen, bilateral: Secondary | ICD-10-CM | POA: Diagnosis not present

## 2016-10-16 DIAGNOSIS — Z681 Body mass index (BMI) 19 or less, adult: Secondary | ICD-10-CM | POA: Diagnosis not present

## 2016-10-16 DIAGNOSIS — J019 Acute sinusitis, unspecified: Secondary | ICD-10-CM | POA: Diagnosis not present

## 2016-10-23 ENCOUNTER — Ambulatory Visit (HOSPITAL_COMMUNITY)
Admission: RE | Admit: 2016-10-23 | Discharge: 2016-10-23 | Disposition: A | Discharge status: Home / Self Care | Payer: Medicare Other | Source: Ambulatory Visit | Attending: Internal Medicine | Admitting: Internal Medicine

## 2016-10-23 ENCOUNTER — Other Ambulatory Visit (HOSPITAL_COMMUNITY): Payer: Self-pay | Admitting: Internal Medicine

## 2016-10-23 DIAGNOSIS — R490 Dysphonia: Secondary | ICD-10-CM | POA: Insufficient documentation

## 2016-10-23 DIAGNOSIS — R05 Cough: Secondary | ICD-10-CM | POA: Insufficient documentation

## 2016-10-23 DIAGNOSIS — J449 Chronic obstructive pulmonary disease, unspecified: Secondary | ICD-10-CM | POA: Diagnosis not present

## 2016-10-23 DIAGNOSIS — R634 Abnormal weight loss: Secondary | ICD-10-CM | POA: Insufficient documentation

## 2016-10-31 DIAGNOSIS — R490 Dysphonia: Secondary | ICD-10-CM | POA: Diagnosis not present

## 2016-11-14 DIAGNOSIS — R490 Dysphonia: Secondary | ICD-10-CM | POA: Diagnosis not present

## 2016-11-27 ENCOUNTER — Emergency Department (HOSPITAL_COMMUNITY): Payer: Medicare Other

## 2016-11-27 ENCOUNTER — Encounter (HOSPITAL_COMMUNITY): Payer: Self-pay

## 2016-11-27 ENCOUNTER — Emergency Department (HOSPITAL_COMMUNITY)
Admission: EM | Admit: 2016-11-27 | Discharge: 2016-11-27 | Disposition: A | Discharge status: Home / Self Care | Payer: Medicare Other | Attending: Emergency Medicine | Admitting: Emergency Medicine

## 2016-11-27 DIAGNOSIS — Y999 Unspecified external cause status: Secondary | ICD-10-CM | POA: Diagnosis not present

## 2016-11-27 DIAGNOSIS — W01198A Fall on same level from slipping, tripping and stumbling with subsequent striking against other object, initial encounter: Secondary | ICD-10-CM | POA: Diagnosis not present

## 2016-11-27 DIAGNOSIS — Z87891 Personal history of nicotine dependence: Secondary | ICD-10-CM | POA: Diagnosis not present

## 2016-11-27 DIAGNOSIS — K029 Dental caries, unspecified: Secondary | ICD-10-CM | POA: Insufficient documentation

## 2016-11-27 DIAGNOSIS — N3000 Acute cystitis without hematuria: Secondary | ICD-10-CM | POA: Diagnosis not present

## 2016-11-27 DIAGNOSIS — S299XXA Unspecified injury of thorax, initial encounter: Secondary | ICD-10-CM | POA: Diagnosis not present

## 2016-11-27 DIAGNOSIS — Z8551 Personal history of malignant neoplasm of bladder: Secondary | ICD-10-CM | POA: Diagnosis not present

## 2016-11-27 DIAGNOSIS — I10 Essential (primary) hypertension: Secondary | ICD-10-CM | POA: Insufficient documentation

## 2016-11-27 DIAGNOSIS — J441 Chronic obstructive pulmonary disease with (acute) exacerbation: Secondary | ICD-10-CM | POA: Insufficient documentation

## 2016-11-27 DIAGNOSIS — M25559 Pain in unspecified hip: Secondary | ICD-10-CM | POA: Diagnosis not present

## 2016-11-27 DIAGNOSIS — Z5181 Encounter for therapeutic drug level monitoring: Secondary | ICD-10-CM | POA: Insufficient documentation

## 2016-11-27 DIAGNOSIS — Y929 Unspecified place or not applicable: Secondary | ICD-10-CM | POA: Diagnosis not present

## 2016-11-27 DIAGNOSIS — Y939 Activity, unspecified: Secondary | ICD-10-CM | POA: Diagnosis not present

## 2016-11-27 DIAGNOSIS — M549 Dorsalgia, unspecified: Secondary | ICD-10-CM | POA: Diagnosis not present

## 2016-11-27 DIAGNOSIS — S199XXA Unspecified injury of neck, initial encounter: Secondary | ICD-10-CM | POA: Diagnosis not present

## 2016-11-27 DIAGNOSIS — W19XXXA Unspecified fall, initial encounter: Secondary | ICD-10-CM

## 2016-11-27 DIAGNOSIS — Z79899 Other long term (current) drug therapy: Secondary | ICD-10-CM | POA: Diagnosis not present

## 2016-11-27 DIAGNOSIS — M545 Low back pain: Secondary | ICD-10-CM | POA: Diagnosis not present

## 2016-11-27 DIAGNOSIS — S0990XA Unspecified injury of head, initial encounter: Secondary | ICD-10-CM | POA: Diagnosis not present

## 2016-11-27 LAB — URINALYSIS, ROUTINE W REFLEX MICROSCOPIC
Bilirubin Urine: NEGATIVE
Glucose, UA: NEGATIVE mg/dL
KETONES UR: 5 mg/dL — AB
Nitrite: NEGATIVE
Protein, ur: 30 mg/dL — AB
Specific Gravity, Urine: 1.015 (ref 1.005–1.030)
pH: 5 (ref 5.0–8.0)

## 2016-11-27 LAB — CBC WITH DIFFERENTIAL/PLATELET
Basophils Absolute: 0 10*3/uL (ref 0.0–0.1)
Basophils Relative: 0 %
Eosinophils Absolute: 0 10*3/uL (ref 0.0–0.7)
Eosinophils Relative: 0 %
HEMATOCRIT: 35.9 % — AB (ref 39.0–52.0)
Hemoglobin: 11.6 g/dL — ABNORMAL LOW (ref 13.0–17.0)
LYMPHS PCT: 12 %
Lymphs Abs: 1 10*3/uL (ref 0.7–4.0)
MCH: 31.7 pg (ref 26.0–34.0)
MCHC: 32.3 g/dL (ref 30.0–36.0)
MCV: 98.1 fL (ref 78.0–100.0)
MONO ABS: 0.6 10*3/uL (ref 0.1–1.0)
MONOS PCT: 7 %
NEUTROS ABS: 7 10*3/uL (ref 1.7–7.7)
Neutrophils Relative %: 81 %
Platelets: 281 10*3/uL (ref 150–400)
RBC: 3.66 MIL/uL — ABNORMAL LOW (ref 4.22–5.81)
RDW: 14.1 % (ref 11.5–15.5)
WBC: 8.6 10*3/uL (ref 4.0–10.5)

## 2016-11-27 LAB — COMPREHENSIVE METABOLIC PANEL
ALT: 14 U/L — ABNORMAL LOW (ref 17–63)
ANION GAP: 8 (ref 5–15)
AST: 15 U/L (ref 15–41)
Albumin: 3.1 g/dL — ABNORMAL LOW (ref 3.5–5.0)
Alkaline Phosphatase: 83 U/L (ref 38–126)
BILIRUBIN TOTAL: 0.5 mg/dL (ref 0.3–1.2)
BUN: 43 mg/dL — ABNORMAL HIGH (ref 6–20)
CALCIUM: 8.9 mg/dL (ref 8.9–10.3)
CO2: 28 mmol/L (ref 22–32)
Chloride: 102 mmol/L (ref 101–111)
Creatinine, Ser: 1.16 mg/dL (ref 0.61–1.24)
GFR calc Af Amer: >60 mL/min (ref >60)
GFR, EST NON AFRICAN AMERICAN: 53 mL/min — AB (ref >60)
Glucose, Bld: 131 mg/dL — ABNORMAL HIGH (ref 65–99)
POTASSIUM: 4 mmol/L (ref 3.5–5.1)
Sodium: 138 mmol/L (ref 135–145)
TOTAL PROTEIN: 6.3 g/dL — AB (ref 6.5–8.1)

## 2016-11-27 LAB — PROTIME-INR
INR: 1.04
Prothrombin Time: 13.6 seconds (ref 11.4–15.2)

## 2016-11-27 LAB — ETHANOL: Alcohol, Ethyl (B): <5 mg/dL (ref <5)

## 2016-11-27 LAB — I-STAT CG4 LACTIC ACID, ED: Lactic Acid, Venous: 1.55 mmol/L (ref 0.5–1.9)

## 2016-11-27 MED ORDER — CEPHALEXIN 500 MG PO CAPS
500.0000 mg | ORAL_CAPSULE | Freq: Once | ORAL | Status: AC
  Administered 2016-11-27: 500 mg via ORAL
  Filled 2016-11-27: qty 1

## 2016-11-27 MED ORDER — ALBUTEROL SULFATE HFA 108 (90 BASE) MCG/ACT IN AERS
1.0000 | INHALATION_SPRAY | RESPIRATORY_TRACT | Status: DC | PRN
  Administered 2016-11-27: 2 via RESPIRATORY_TRACT
  Filled 2016-11-27: qty 6.7

## 2016-11-27 MED ORDER — IPRATROPIUM-ALBUTEROL 0.5-2.5 (3) MG/3ML IN SOLN
3.0000 mL | Freq: Once | RESPIRATORY_TRACT | Status: AC
  Administered 2016-11-27: 3 mL via RESPIRATORY_TRACT
  Filled 2016-11-27: qty 3

## 2016-11-27 MED ORDER — SODIUM CHLORIDE 0.9 % IV SOLN
INTRAVENOUS | Status: DC
  Administered 2016-11-27: 21:00:00 via INTRAVENOUS

## 2016-11-27 MED ORDER — CEPHALEXIN 500 MG PO CAPS: 500.0000 mg | ORAL_CAPSULE | Freq: Four times a day (QID) | ORAL | 0 refills | Status: DC

## 2016-11-27 MED ORDER — HYDROCODONE-ACETAMINOPHEN 5-325 MG PO TABS: 1.0000 | ORAL_TABLET | ORAL | 0 refills | Status: DC | PRN

## 2016-11-27 MED ORDER — AEROCHAMBER PLUS FLO-VU MEDIUM MISC
1.0000 | Freq: Once | Status: AC
  Administered 2016-11-27: 1
  Filled 2016-11-27: qty 1

## 2016-11-27 MED ORDER — PREDNISONE 10 MG (21) PO TBPK: ORAL_TABLET | ORAL | 0 refills | Status: DC

## 2016-11-27 MED ORDER — SODIUM CHLORIDE 0.9 % IV BOLUS (SEPSIS)
1000.0000 mL | Freq: Once | INTRAVENOUS | Status: AC
  Administered 2016-11-27: 1000 mL via INTRAVENOUS

## 2016-11-27 NOTE — ED Triage Notes (Addendum)
Family reports patient patient tripped over coffee table and landed on back, hit the back of his head 7 days ago. Reports of passing out for "2 hours". Also reports of dizziness yesterday but denies today.  Patient has congested cough noted in triage.

## 2016-11-27 NOTE — ED Notes (Signed)
Pt discharged home with family  With AVS and prescriptions reviewed with verbalized understandings given.  PIV removed with hemostasis achieved.

## 2016-11-27 NOTE — ED Notes (Signed)
Placed seizure padding on bed.

## 2016-11-27 NOTE — ED Provider Notes (Signed)
[facility] DEPT Provider Note   CSN: [id number] Arrival date & time: 11/27/16  1617     History   Chief Complaint Chief Complaint  Patient presents with  . Fall  . Loss of Consciousness    HPI Bruce Webster is a 83 y.o. male.  Pt presents to the ED today s/p fall 1 week ago.  Pt said that he fell 1 week ago and passed out for 2 hours.  The pt has also had a cough.  He did see his pcp for the cough last week and was put on abx.    He lives alone, but he does have people that come out to check on him.  The pt's family came up today to visit and he told them that he fell.  They were also concerned about his breathing and cough.   Pt does have some lower back and right hip pain.      Past Medical History:  Diagnosis Date  . Acute blood loss anemia 04/23/2015  . Acute kidney injury ([place]) 04/23/2015  . Cancer [facility])    Bladder  . Hypertension     Patient Active Problem List   Diagnosis Date Noted  . Essential hypertension, benign   . Malnutrition of moderate degree 04/24/2015  . Acute blood loss anemia 04/23/2015  . UTI (urinary tract infection) 04/23/2015  . Acute kidney injury ([place]) 04/23/2015  . Intertrochanteric fracture of right hip ([place]) 04/22/2015  . HTN (hypertension) 04/22/2015  . Right hip pain 04/22/2015  . Hip fracture ([place]) 04/22/2015    Past Surgical History:  Procedure Laterality Date  . CHOLECYSTECTOMY    . EYE SURGERY     Cateract  . INTRAMEDULLARY (IM) NAIL INTERTROCHANTERIC Right 04/22/2015   Procedure: INTRAMEDULLARY (IM) NAIL RIGHT HIP;  Surgeon: Meredith Pel, MD;  Location: [facility];  Service: Orthopedics;  Laterality: Right;  . KIDNEY STONE SURGERY    . TONSILLECTOMY         Home Medications    Prior to Admission medications   Medication Sig Start Date End Date Taking? Authorizing Provider  amLODipine (NORVASC) 10 MG tablet Take 1 tablet (10 mg total) by mouth daily. 04/26/15  Yes Dhungel, Nishant, MD  cetirizine (ZYRTEC) 10  MG tablet Take 20 mg by mouth daily.   Yes [provider]  cholecalciferol (VITAMIN D) 1000 UNITS tablet Take 1,000 Units by mouth daily.   Yes [provider]  lisinopril (PRINIVIL,ZESTRIL) 10 MG tablet Take 10 mg by mouth daily. 04/05/15  Yes [provider]  cephALEXin (KEFLEX) 500 MG capsule Take 1 capsule (500 mg total) by mouth 4 (four) times daily. 11/27/16   Isla Pence, MD  HYDROcodone-acetaminophen (NORCO/VICODIN) 5-325 MG tablet Take 1 tablet by mouth every 4 (four) hours as needed. 11/27/16   Isla Pence, MD  predniSONE (STERAPRED UNI-PAK 21 TAB) 10 MG (21) TBPK tablet Take 6 tabs by mouth daily  for 2 days, then 5 tabs for 2 days, then 4 tabs for 2 days, then 3 tabs for 2 days, 2 tabs for 2 days, then 1 tab by mouth daily for 2 days 11/27/16   Isla Pence, MD    Family History No family history on file.  Social History Social History  Substance Use Topics  . Smoking status: Former Smoker    Packs/day: 0.25    Types: Cigarettes, Cigars    Quit date: 02/01/2015  . Smokeless tobacco: Never Used  . Alcohol use No  Allergies   Patient has no known allergies.   Review of Systems Review of Systems  Respiratory: Positive for cough, shortness of breath and wheezing.   Musculoskeletal: Positive for back pain.       Right hip pain  All other systems reviewed and are negative.    Physical Exam Updated Vital Signs BP (!) 145/63   Pulse 68   Temp 98 F (36.7 C)   Resp (!) 25   Ht 5\' 9"  (1.753 m)   Wt 45.4 kg (100 lb)   SpO2 94%   BMI 14.77 kg/m   Physical Exam  Constitutional: He is oriented to person, place, and time. He appears well-developed and well-nourished.  HENT:  Head: Normocephalic and atraumatic.  Right Ear: External ear normal.  Left Ear: External ear normal.  Nose: Nose normal.  Mouth/Throat: Mucous membranes are dry. Dental caries present.  Eyes: Conjunctivae and EOM are normal. Pupils are equal, round, and  reactive to light.  Neck: Normal range of motion. Neck supple.  Cardiovascular: Normal rate, regular rhythm, normal heart sounds and intact distal pulses.   Pulmonary/Chest: Effort normal. He has wheezes. He has rales.  Abdominal: Soft. Bowel sounds are normal.  Musculoskeletal: Normal range of motion.  Neurological: He is alert and oriented to person, place, and time.  Skin: Skin is warm.  Psychiatric: He has a normal mood and affect. His behavior is normal. Judgment and thought content normal.  Nursing note and vitals reviewed.    ED Treatments / Results  Labs (all labs ordered are listed, but only abnormal results are displayed) Labs Reviewed  CBC WITH DIFFERENTIAL/PLATELET - Abnormal; Notable for the following:       Result Value   RBC 3.66 (*)    Hemoglobin 11.6 (*)    HCT 35.9 (*)    All other components within normal limits  COMPREHENSIVE METABOLIC PANEL - Abnormal; Notable for the following:    Glucose, Bld 131 (*)    BUN 43 (*)    Total Protein 6.3 (*)    Albumin 3.1 (*)    ALT 14 (*)    GFR calc non Af Amer 53 (*)    All other components within normal limits  URINALYSIS, ROUTINE W REFLEX MICROSCOPIC - Abnormal; Notable for the following:    APPearance HAZY (*)    Hgb urine dipstick SMALL (*)    Ketones, ur 5 (*)    Protein, ur 30 (*)    Leukocytes, UA LARGE (*)    Bacteria, UA FEW (*)    Squamous Epithelial / LPF 0-5 (*)    All other components within normal limits  URINE CULTURE  ETHANOL  PROTIME-INR  I-STAT CG4 LACTIC ACID, ED    EKG  EKG Interpretation  Date/Time:  Monday Nov 27 2016 18:09:54 EDT Ventricular Rate:  73 PR Interval:    QRS Duration: 166 QT Interval:  411 QTC Calculation: 453 R Axis:   78 Text Interpretation:  Sinus rhythm Atrial premature complex Nonspecific intraventricular conduction delay Inferior infarct, acute (RCA) Probable anteroseptal infarct, old Probable RV involvement, suggest recording right precordial leads Baseline  wander in lead(s) V5 pt is tremulous. Confirmed by Isla Pence [phone]) on 11/27/2016 6:14:48 PM       Radiology Dg Chest 2 View  Result Date: 11/27/2016 CLINICAL DATA:  83 year old male with fall and trauma to the back. EXAM: CHEST  2 VIEW COMPARISON:  Chest radiograph dated 10/23/2016 FINDINGS: There is emphysematous changes of the lungs with hyperinflation and  flattening of the diaphragms. Focal area of mild hazy density at the posterior costophrenic angles seen on the lateral projection may represent an area of atelectatic changes. Pneumonia is less likely. Clinical correlation is recommended. There is no pleural effusion or pneumothorax. The cardiac silhouette is within normal limits. The aorta is tortuous. There is osteopenia with degenerative changes of the spine. No acute fracture. IMPRESSION: 1. No acute cardiopulmonary process. 2. COPD changes. Electronically Signed   By: Anner Crete M.D.   On: 11/27/2016 19:09   Dg Lumbar Spine Complete  Result Date: 11/27/2016 CLINICAL DATA:  Low back pain after fall. EXAM: LUMBAR SPINE - COMPLETE 4+ VIEW COMPARISON:  CT scan of April 27, 2016. FINDINGS: Mild grade 1 anterolisthesis of L5-S1 is noted secondary to bilateral pars defects of L5. Minimal grade 1 retrolisthesis of L1-2 is noted secondary to severe degenerative disc disease at this level. Degenerative disc disease is also noted at L2-3 with anterior osteophyte formation. No acute fracture is noted. IMPRESSION: Multilevel degenerative disc disease. Mild grade 1 anterolisthesis of L5-S1 secondary to bilateral pars defects of L5. No acute abnormality seen in the lumbar spine. Electronically Signed   By: Marijo Conception, M.D.   On: 11/27/2016 19:09   Ct Head Wo Contrast  Result Date: 11/27/2016 CLINICAL DATA:  83 year old male with fall. EXAM: CT HEAD WITHOUT CONTRAST CT CERVICAL SPINE WITHOUT CONTRAST TECHNIQUE: Multidetector CT imaging of the head and cervical spine was performed  following the standard protocol without intravenous contrast. Multiplanar CT image reconstructions of the cervical spine were also generated. COMPARISON:  None. FINDINGS: CT HEAD FINDINGS Brain: There is moderate age-related atrophy and chronic microvascular ischemic changes. There is no acute intracranial hemorrhage. No mass effect or midline shift noted. No extra-axial fluid collection. Vascular: No hyperdense vessel or unexpected calcification. Skull: Normal. Negative for fracture or focal lesion. Sinuses/Orbits: No acute finding. Other: Postsurgical changes of left mastoidectomy. CT CERVICAL SPINE FINDINGS Alignment: No acute subluxation. Skull base and vertebrae: No acute fracture. The bones are osteopenic. Multiple lucent lesions throughout the spine likely related to osteopenia. Soft tissues and spinal canal: No prevertebral fluid or swelling. No visible canal hematoma. Disc levels: Multilevel degenerative changes and disc disease most pronounced at C4-C5, C6-C7, and C7-T1. Upper chest: Biapical pleural thickening and scarring. Other: Bilateral carotid bulb atherosclerotic plaques. IMPRESSION: 1. No acute intracranial hemorrhage. Moderate age-related atrophy and chronic microvascular ischemic changes. 2. No acute/traumatic cervical spine pathology. Chronic degenerative changes. Electronically Signed   By: Anner Crete M.D.   On: 11/27/2016 19:29   Ct Cervical Spine Wo Contrast  Result Date: 11/27/2016 CLINICAL DATA:  83 year old male with fall. EXAM: CT HEAD WITHOUT CONTRAST CT CERVICAL SPINE WITHOUT CONTRAST TECHNIQUE: Multidetector CT imaging of the head and cervical spine was performed following the standard protocol without intravenous contrast. Multiplanar CT image reconstructions of the cervical spine were also generated. COMPARISON:  None. FINDINGS: CT HEAD FINDINGS Brain: There is moderate age-related atrophy and chronic microvascular ischemic changes. There is no acute intracranial  hemorrhage. No mass effect or midline shift noted. No extra-axial fluid collection. Vascular: No hyperdense vessel or unexpected calcification. Skull: Normal. Negative for fracture or focal lesion. Sinuses/Orbits: No acute finding. Other: Postsurgical changes of left mastoidectomy. CT CERVICAL SPINE FINDINGS Alignment: No acute subluxation. Skull base and vertebrae: No acute fracture. The bones are osteopenic. Multiple lucent lesions throughout the spine likely related to osteopenia. Soft tissues and spinal canal: No prevertebral fluid or swelling. No visible canal hematoma.  Disc levels: Multilevel degenerative changes and disc disease most pronounced at C4-C5, C6-C7, and C7-T1. Upper chest: Biapical pleural thickening and scarring. Other: Bilateral carotid bulb atherosclerotic plaques. IMPRESSION: 1. No acute intracranial hemorrhage. Moderate age-related atrophy and chronic microvascular ischemic changes. 2. No acute/traumatic cervical spine pathology. Chronic degenerative changes. Electronically Signed   By: Anner Crete M.D.   On: 11/27/2016 19:29   Dg Hip Unilat W Or Wo Pelvis 2-3 Views Right  Result Date: 11/27/2016 CLINICAL DATA:  Fall. EXAM: DG HIP (WITH OR WITHOUT PELVIS) 2-3V RIGHT COMPARISON:  None. FINDINGS: Status post surgical internal fixation of old proximal right femur fracture. Intramedullary rod fixation of right femoral shaft is noted as well. No acute fracture or dislocation is noted. Vascular calcifications are noted. IMPRESSION: Postsurgical changes as described above. No acute abnormality seen in the right hip. Electronically Signed   By: Marijo Conception, M.D.   On: 11/27/2016 19:11    Procedures Procedures (including critical care time)  Medications Ordered in ED Medications  sodium chloride 0.9 % bolus 1,000 mL (0 mLs Intravenous Stopped 11/27/16 2100)  ipratropium-albuterol (DUONEB) 0.5-2.5 (3) MG/3ML nebulizer solution 3 mL (3 mLs Nebulization Given 11/27/16 1929)    AEROCHAMBER PLUS FLO-VU MEDIUM MISC 1 each (1 each Other Given 11/27/16 2233)  cephALEXin (KEFLEX) capsule 500 mg (500 mg Oral Given 11/27/16 2215)     Initial Impression / Assessment and Plan / ED Course  I have reviewed the triage vital signs and the nursing notes.  Pertinent labs & imaging results that were available during my care of the patient were reviewed by me and considered in my medical decision making (see chart for details).   Pt is feeling better.  He was given her first dose of abx here.  I offered pt home health.  He refuses.  He is very independent and said that his friends check on him daily.  He now has his family's number on speed dial and understands to call if he falls again.  He knows to return if worse.  Final Clinical Impressions(s) / ED Diagnoses   Final diagnoses:  Fall, initial encounter  COPD exacerbation ([place])  Acute cystitis without hematuria    New Prescriptions Discharge Medication List as of 11/27/2016 10:02 PM    START taking these medications   Details  cephALEXin (KEFLEX) 500 MG capsule Take 1 capsule (500 mg total) by mouth 4 (four) times daily., Starting Mon 11/27/2016, Print    HYDROcodone-acetaminophen (NORCO/VICODIN) 5-325 MG tablet Take 1 tablet by mouth every 4 (four) hours as needed., Starting Mon 11/27/2016, Print    predniSONE (STERAPRED UNI-PAK 21 TAB) 10 MG (21) TBPK tablet Take 6 tabs by mouth daily  for 2 days, then 5 tabs for 2 days, then 4 tabs for 2 days, then 3 tabs for 2 days, 2 tabs for 2 days, then 1 tab by mouth daily for 2 days, Print         Isla Pence, MD 11/28/16 1607

## 2016-11-30 DIAGNOSIS — J449 Chronic obstructive pulmonary disease, unspecified: Secondary | ICD-10-CM | POA: Diagnosis not present

## 2016-11-30 DIAGNOSIS — N390 Urinary tract infection, site not specified: Secondary | ICD-10-CM | POA: Diagnosis not present

## 2016-11-30 DIAGNOSIS — I10 Essential (primary) hypertension: Secondary | ICD-10-CM | POA: Diagnosis not present

## 2016-11-30 LAB — URINE CULTURE

## 2016-12-01 ENCOUNTER — Telehealth: Payer: Self-pay | Admitting: *Deleted

## 2016-12-01 NOTE — Telephone Encounter (Signed)
Post ED Visit - Positive Culture Follow-up: Unsuccessful Patient Follow-up  Culture assessed and recommendations reviewed by:  [x]  Elenor Quinones, Pharm.D. []  Heide Guile, Pharm.D., BCPS AQ-ID []  Parks Neptune, Pharm.D., BCPS []  Alycia Rossetti, Pharm.D., BCPS []  Lynn, [place].D., BCPS, AAHIVP []  Legrand Como, Pharm.D., BCPS, AAHIVP []  Salome Arnt, PharmD, BCPS []  Dimitri Ped, PharmD, BCPS []  Vincenza Hews, PharmD, BCPS  Positive urine culture reviewed by Wyn Quaker PA-C  []  Patient discharged without antimicrobial prescription and treatment is now indicated [x]  Organism is resistant to prescribed ED discharge antimicrobial []  Patient with positive blood cultures   Unable to contact patient after 3 attempts, letter will be sent to address on file  Ardeen Fillers 12/01/2016, 10:37 AM

## 2016-12-01 NOTE — Progress Notes (Signed)
ED Antimicrobial Stewardship Positive Culture Follow Up   Bruce Webster is an 83 y.o. male who presented to [facility] on 11/27/2016 with a chief complaint of  Chief Complaint  Patient presents with  . Fall  . Loss of Consciousness    Recent Results (from the past 720 hour(s))  Urine culture     Status: Abnormal   Collection Time: 11/27/16 10:02 PM  Result Value Ref Range Status   Specimen Description URINE, CLEAN CATCH  Final   Special Requests NONE  Final   Culture >=100,000 COLONIES/mL ENTEROCOCCUS FAECALIS (A)  Final   Report Status 11/30/2016 FINAL  Final   Organism ID, Bacteria ENTEROCOCCUS FAECALIS (A)  Final      Susceptibility   Enterococcus faecalis - MIC*    AMPICILLIN <=2 SENSITIVE Sensitive     LEVOFLOXACIN >=8 RESISTANT Resistant     NITROFURANTOIN <=16 SENSITIVE Sensitive     VANCOMYCIN 1 SENSITIVE Sensitive     * >=100,000 COLONIES/mL ENTEROCOCCUS FAECALIS    [x]  Treated with cephalexin, organism resistant to prescribed antimicrobial  New antibiotic prescription: Stop cephalexin, start amoxicillin 500mg  PO BID x 7 days  ED Provider: Wyn Quaker PA-C   Reginia Naas 12/01/2016, 9:21 AM Infectious Diseases Pharmacist Phone# [phone]

## 2016-12-17 ENCOUNTER — Inpatient Hospital Stay (HOSPITAL_COMMUNITY)
Admission: EM | Admit: 2016-12-17 | Discharge: 2016-12-21 | DRG: 308 | Disposition: A | Discharge status: Skilled Nursing Facility | Payer: Medicare Other | Attending: Internal Medicine | Admitting: Internal Medicine

## 2016-12-17 ENCOUNTER — Emergency Department (HOSPITAL_COMMUNITY): Payer: Medicare Other

## 2016-12-17 ENCOUNTER — Encounter (HOSPITAL_COMMUNITY): Payer: Self-pay | Admitting: Emergency Medicine

## 2016-12-17 ENCOUNTER — Observation Stay (HOSPITAL_COMMUNITY): Payer: Medicare Other

## 2016-12-17 DIAGNOSIS — R55 Syncope and collapse: Secondary | ICD-10-CM | POA: Diagnosis not present

## 2016-12-17 DIAGNOSIS — Z515 Encounter for palliative care: Secondary | ICD-10-CM | POA: Diagnosis not present

## 2016-12-17 DIAGNOSIS — Z87891 Personal history of nicotine dependence: Secondary | ICD-10-CM

## 2016-12-17 DIAGNOSIS — I248 Other forms of acute ischemic heart disease: Secondary | ICD-10-CM | POA: Diagnosis not present

## 2016-12-17 DIAGNOSIS — I480 Paroxysmal atrial fibrillation: Secondary | ICD-10-CM | POA: Diagnosis not present

## 2016-12-17 DIAGNOSIS — L89159 Pressure ulcer of sacral region, unspecified stage: Secondary | ICD-10-CM | POA: Diagnosis not present

## 2016-12-17 DIAGNOSIS — Z66 Do not resuscitate: Secondary | ICD-10-CM | POA: Diagnosis present

## 2016-12-17 DIAGNOSIS — C799 Secondary malignant neoplasm of unspecified site: Secondary | ICD-10-CM

## 2016-12-17 DIAGNOSIS — N390 Urinary tract infection, site not specified: Secondary | ICD-10-CM | POA: Diagnosis present

## 2016-12-17 DIAGNOSIS — R000 Tachycardia, unspecified: Secondary | ICD-10-CM | POA: Diagnosis not present

## 2016-12-17 DIAGNOSIS — I10 Essential (primary) hypertension: Secondary | ICD-10-CM | POA: Diagnosis present

## 2016-12-17 DIAGNOSIS — R404 Transient alteration of awareness: Secondary | ICD-10-CM | POA: Diagnosis not present

## 2016-12-17 DIAGNOSIS — R05 Cough: Secondary | ICD-10-CM | POA: Diagnosis not present

## 2016-12-17 DIAGNOSIS — Z7189 Other specified counseling: Secondary | ICD-10-CM

## 2016-12-17 DIAGNOSIS — I4891 Unspecified atrial fibrillation: Secondary | ICD-10-CM | POA: Diagnosis not present

## 2016-12-17 DIAGNOSIS — R918 Other nonspecific abnormal finding of lung field: Secondary | ICD-10-CM | POA: Diagnosis not present

## 2016-12-17 DIAGNOSIS — J449 Chronic obstructive pulmonary disease, unspecified: Secondary | ICD-10-CM | POA: Diagnosis not present

## 2016-12-17 DIAGNOSIS — J069 Acute upper respiratory infection, unspecified: Secondary | ICD-10-CM

## 2016-12-17 DIAGNOSIS — Z681 Body mass index (BMI) 19 or less, adult: Secondary | ICD-10-CM

## 2016-12-17 DIAGNOSIS — Z7982 Long term (current) use of aspirin: Secondary | ICD-10-CM

## 2016-12-17 DIAGNOSIS — R059 Cough, unspecified: Secondary | ICD-10-CM | POA: Diagnosis present

## 2016-12-17 DIAGNOSIS — Z8551 Personal history of malignant neoplasm of bladder: Secondary | ICD-10-CM

## 2016-12-17 DIAGNOSIS — E43 Unspecified severe protein-calorie malnutrition: Secondary | ICD-10-CM | POA: Diagnosis not present

## 2016-12-17 DIAGNOSIS — Z79899 Other long term (current) drug therapy: Secondary | ICD-10-CM

## 2016-12-17 DIAGNOSIS — R42 Dizziness and giddiness: Secondary | ICD-10-CM | POA: Diagnosis not present

## 2016-12-17 DIAGNOSIS — I482 Chronic atrial fibrillation: Secondary | ICD-10-CM | POA: Diagnosis not present

## 2016-12-17 LAB — I-STAT CHEM 8, ED
BUN: 36 mg/dL — AB (ref 6–20)
CALCIUM ION: 1.13 mmol/L — AB (ref 1.15–1.40)
CREATININE: 1.2 mg/dL (ref 0.61–1.24)
Chloride: 101 mmol/L (ref 101–111)
Glucose, Bld: 114 mg/dL — ABNORMAL HIGH (ref 65–99)
HCT: 40 % (ref 39.0–52.0)
Hemoglobin: 13.6 g/dL (ref 13.0–17.0)
Potassium: 4.6 mmol/L (ref 3.5–5.1)
Sodium: 138 mmol/L (ref 135–145)
TCO2: 27 mmol/L (ref 0–100)

## 2016-12-17 LAB — COMPREHENSIVE METABOLIC PANEL
ALT: 17 U/L (ref 17–63)
AST: 18 U/L (ref 15–41)
Albumin: 3.2 g/dL — ABNORMAL LOW (ref 3.5–5.0)
Alkaline Phosphatase: 102 U/L (ref 38–126)
Anion gap: 9 (ref 5–15)
BILIRUBIN TOTAL: 0.5 mg/dL (ref 0.3–1.2)
BUN: 35 mg/dL — ABNORMAL HIGH (ref 6–20)
CALCIUM: 9.2 mg/dL (ref 8.9–10.3)
CO2: 26 mmol/L (ref 22–32)
CREATININE: 1.22 mg/dL (ref 0.61–1.24)
Chloride: 103 mmol/L (ref 101–111)
GFR, EST AFRICAN AMERICAN: 57 mL/min — AB (ref >60)
GFR, EST NON AFRICAN AMERICAN: 50 mL/min — AB (ref >60)
Glucose, Bld: 115 mg/dL — ABNORMAL HIGH (ref 65–99)
Potassium: 4.7 mmol/L (ref 3.5–5.1)
Sodium: 138 mmol/L (ref 135–145)
TOTAL PROTEIN: 6.4 g/dL — AB (ref 6.5–8.1)

## 2016-12-17 LAB — CBC WITH DIFFERENTIAL/PLATELET
BASOS PCT: 0 %
Basophils Absolute: 0 10*3/uL (ref 0.0–0.1)
EOS ABS: 0.1 10*3/uL (ref 0.0–0.7)
Eosinophils Relative: 1 %
HEMATOCRIT: 41.2 % (ref 39.0–52.0)
Hemoglobin: 13.2 g/dL (ref 13.0–17.0)
Lymphocytes Relative: 10 %
Lymphs Abs: 1.3 10*3/uL (ref 0.7–4.0)
MCH: 31.7 pg (ref 26.0–34.0)
MCHC: 32 g/dL (ref 30.0–36.0)
MCV: 98.8 fL (ref 78.0–100.0)
MONO ABS: 0.6 10*3/uL (ref 0.1–1.0)
MONOS PCT: 5 %
NEUTROS ABS: 10.6 10*3/uL — AB (ref 1.7–7.7)
Neutrophils Relative %: 84 %
Platelets: 225 10*3/uL (ref 150–400)
RBC: 4.17 MIL/uL — ABNORMAL LOW (ref 4.22–5.81)
RDW: 14.6 % (ref 11.5–15.5)
WBC: 12.6 10*3/uL — ABNORMAL HIGH (ref 4.0–10.5)

## 2016-12-17 LAB — TROPONIN I: Troponin I: 0.07 ng/mL (ref <0.03)

## 2016-12-17 LAB — MAGNESIUM: MAGNESIUM: 2.2 mg/dL (ref 1.7–2.4)

## 2016-12-17 MED ORDER — HEPARIN BOLUS VIA INFUSION
3000.0000 [IU] | Freq: Once | INTRAVENOUS | Status: AC
  Administered 2016-12-18: 3000 [IU] via INTRAVENOUS
  Filled 2016-12-17: qty 3000

## 2016-12-17 MED ORDER — SODIUM CHLORIDE 0.9 % IV SOLN
INTRAVENOUS | Status: DC
  Administered 2016-12-18 (×2): via INTRAVENOUS

## 2016-12-17 MED ORDER — HYDROCODONE-ACETAMINOPHEN 5-325 MG PO TABS: 1.0000 | ORAL_TABLET | ORAL | Status: DC | PRN

## 2016-12-17 MED ORDER — LORATADINE 10 MG PO TABS
10.0000 mg | ORAL_TABLET | Freq: Every day | ORAL | Status: DC
  Administered 2016-12-18 – 2016-12-21 (×4): 10 mg via ORAL
  Filled 2016-12-17 (×4): qty 1

## 2016-12-17 MED ORDER — SODIUM CHLORIDE 0.9% FLUSH
3.0000 mL | Freq: Two times a day (BID) | INTRAVENOUS | Status: DC
  Administered 2016-12-19 – 2016-12-20 (×4): 3 mL via INTRAVENOUS

## 2016-12-17 MED ORDER — HEPARIN (PORCINE) IN NACL 100-0.45 UNIT/ML-% IJ SOLN
600.0000 [IU]/h | INTRAMUSCULAR | Status: DC
  Administered 2016-12-18: 600 [IU]/h via INTRAVENOUS
  Filled 2016-12-17: qty 250

## 2016-12-17 MED ORDER — SODIUM CHLORIDE 0.9 % IV BOLUS (SEPSIS)
500.0000 mL | Freq: Once | INTRAVENOUS | Status: AC
  Administered 2016-12-17: 500 mL via INTRAVENOUS

## 2016-12-17 MED ORDER — METOPROLOL TARTRATE 25 MG PO TABS: 12.5000 mg | ORAL_TABLET | Freq: Four times a day (QID) | ORAL | Status: DC

## 2016-12-17 MED ORDER — METOPROLOL TARTRATE 12.5 MG HALF TABLET
12.5000 mg | ORAL_TABLET | Freq: Two times a day (BID) | ORAL | Status: DC
  Administered 2016-12-18 – 2016-12-21 (×6): 12.5 mg via ORAL
  Filled 2016-12-17 (×8): qty 1

## 2016-12-17 MED ORDER — IOPAMIDOL (ISOVUE-300) INJECTION 61%
INTRAVENOUS | Status: AC
  Administered 2016-12-17: 75 mL
  Filled 2016-12-17: qty 75

## 2016-12-17 NOTE — ED Provider Notes (Addendum)
[facility] DEPT Provider Note   CSN: [id number] Arrival date & time: 12/17/16  2053     History   Chief Complaint Chief Complaint  Patient presents with  . Dizziness  . Near Syncope    HPI Bruce Webster is a 83 y.o. male.  HPI Patient presents with near syncopal episode. He is 83 years old lives alone but has people to check on him. Reportedly was sitting there and had a near syncopal episode. Has had a cough with some sputum production. Difficulty speaking  Has had a cough. States he's felt bad for on 3 weeks. Seen in the ER 3 weeks ago and treated with Keflex for UTI. Had had COPD at that time. States he does feel his heart going a little fast. Reportedly had pressure in the 80s for EMS. Pressure in the 90s here. No history of atrial fibrillation. No fevers. Reportedly has decubitus ulcers also.  Past Medical History:  Diagnosis Date  . Acute blood loss anemia 04/23/2015  . Acute kidney injury ([place]) 04/23/2015  . Cancer [facility])    Bladder  . Hypertension     Patient Active Problem List   Diagnosis Date Noted  . Essential hypertension, benign   . Malnutrition of moderate degree 04/24/2015  . Acute blood loss anemia 04/23/2015  . UTI (urinary tract infection) 04/23/2015  . Acute kidney injury ([place]) 04/23/2015  . Intertrochanteric fracture of right hip ([place]) 04/22/2015  . HTN (hypertension) 04/22/2015  . Right hip pain 04/22/2015  . Hip fracture ([place]) 04/22/2015    Past Surgical History:  Procedure Laterality Date  . CHOLECYSTECTOMY    . EYE SURGERY     Cateract  . INTRAMEDULLARY (IM) NAIL INTERTROCHANTERIC Right 04/22/2015   Procedure: INTRAMEDULLARY (IM) NAIL RIGHT HIP;  Surgeon: Meredith Pel, MD;  Location: [facility];  Service: Orthopedics;  Laterality: Right;  . KIDNEY STONE SURGERY    . TONSILLECTOMY         Home Medications    Prior to Admission medications   Medication Sig Start Date End Date Taking? Authorizing Provider  cetirizine  (ZYRTEC) 10 MG tablet Take 20 mg by mouth daily.   Yes [provider]  cholecalciferol (VITAMIN D) 1000 UNITS tablet Take 1,000 Units by mouth daily.   Yes [provider]  HYDROcodone-acetaminophen (NORCO/VICODIN) 5-325 MG tablet Take 1 tablet by mouth every 4 (four) hours as needed. Patient taking differently: Take 1 tablet by mouth every 4 (four) hours as needed for severe pain. Patient takes only 1/2 tablet for back pain 11/27/16  Yes Isla Pence, MD  lisinopril (PRINIVIL,ZESTRIL) 10 MG tablet Take 10 mg by mouth daily. 04/05/15  Yes [provider]  amLODipine (NORVASC) 10 MG tablet Take 1 tablet (10 mg total) by mouth daily. Patient not taking: Reported on 12/17/2016 04/26/15   Louellen Molder, MD    Family History No family history on file.  Social History Social History  Substance Use Topics  . Smoking status: Former Smoker    Packs/day: 0.25    Types: Cigarettes, Cigars    Quit date: 02/01/2015  . Smokeless tobacco: Never Used  . Alcohol use No     Allergies   Patient has no known allergies.   Review of Systems Review of Systems  Constitutional: Negative for appetite change and fever.  HENT: Negative for sore throat.   Respiratory: Positive for cough and shortness of breath.   Cardiovascular: Positive for palpitations. Negative for chest pain.  Gastrointestinal: Negative for abdominal  pain.  Genitourinary: Negative for flank pain.  Musculoskeletal: Negative for back pain.  Skin: Positive for wound.  Neurological: Positive for dizziness. Negative for speech difficulty.     Physical Exam Updated Vital Signs BP 96/83   Pulse (!) 121   Temp 97.6 F (36.4 C) (Oral)   Resp (!) 26   SpO2 100%   Physical Exam  Constitutional: He appears well-developed.  Somewhat cachectic  HENT:  Head: Atraumatic.  Eyes: Pupils are equal, round, and reactive to light.  Neck: Neck supple.  Cardiovascular:  Irregular tachycardia  Pulmonary/Chest:    Diffuse harsh breath sounds with wheezes.  Abdominal: There is no tenderness.  Musculoskeletal: He exhibits no edema.  Neurological: He is alert.  Skin: Skin is warm.  2 small pressure  ulcers along his lower spine. Also in his sacral area has to approximately 1 cm ulcers to go just through the skin.   Psychiatric: He has a normal mood and affect.     ED Treatments / Results  Labs (all labs ordered are listed, but only abnormal results are displayed) Labs Reviewed  CBC WITH DIFFERENTIAL/PLATELET - Abnormal; Notable for the following:       Result Value   WBC 12.6 (*)    RBC 4.17 (*)    Neutro Abs 10.6 (*)    All other components within normal limits  I-STAT CHEM 8, ED - Abnormal; Notable for the following:    BUN 36 (*)    Glucose, Bld 114 (*)    Calcium, Ion 1.13 (*)    All other components within normal limits  TROPONIN I  MAGNESIUM  COMPREHENSIVE METABOLIC PANEL  URINALYSIS, ROUTINE W REFLEX MICROSCOPIC    EKG  EKG Interpretation  Date/Time:  Sunday December 17 2016 21:08:31 EDT Ventricular Rate:  111 PR Interval:    QRS Duration: 75 QT Interval:  305 QTC Calculation: 415 R Axis:   47 Text Interpretation:  Atrial fibrillation Baseline wander in lead(s) II V3 Confirmed by Alvino Chapel  MD, Dilon Lank [phone]) on 12/17/2016 9:11:12 PM       Radiology Dg Chest Port 1 View  Result Date: 12/17/2016 CLINICAL DATA:  Cough and dizziness. Productive cough with clear sputum. EXAM: PORTABLE CHEST 1 VIEW COMPARISON:  11/27/2016 FINDINGS: Stable hyperinflation. Normal heart size with stable tortuosity and atherosclerosis of the thoracic aorta. No consolidation, pulmonary edema, pleural effusion or pneumothorax. Stable osseous structures. IMPRESSION: 1. No acute abnormality. 2. Stable hyperinflation suggesting COPD. 3. Atherosclerosis of the thoracic aorta. Electronically Signed   By: Jeb Levering M.D.   On: 12/17/2016 22:00    Procedures Procedures (including critical care  time)  Medications Ordered in ED Medications  sodium chloride 0.9 % bolus 500 mL (500 mLs Intravenous New Bag/Given 12/17/16 2143)     Initial Impression / Assessment and Plan / ED Course  I have reviewed the triage vital signs and the nursing notes.  Pertinent labs & imaging results that were available during my care of the patient were reviewed by me and considered in my medical decision making (see chart for details).    ChadsVasc of 3 for age and HTN.   Patient presents feeling bad. Has felt bad for at least 3 weeks. Has had a cough and has been on Keflex. Also had recent UTI. Feeling worse today. Has new onset atrial fibrillation. Felt worse today but I do not know when the A. fib would've started. Do not of his been in the last 48 hours. Mild hypotension. Probably  is somewhat volume depleted. Has a history of malnutrition. Blood pressures improved somewhat with IV fluids. No focal pneumonia on x-ray. Afebrile. Will admit to internal medicine. Will allow hospitalist and cardiology decide on anticoagulation. He has had some recent falls.  CRITICAL CARE Performed by: Mackie Pai Total critical care time: 30 minutes Critical care time was exclusive of separately billable procedures and treating other patients. Critical care was necessary to treat or prevent imminent or life-threatening deterioration. Critical care was time spent personally by me on the following activities: development of treatment plan with patient and/or surrogate as well as nursing, discussions with consultants, evaluation of patient's response to treatment, examination of patient, obtaining history from patient or surrogate, ordering and performing treatments and interventions, ordering and review of laboratory studies, ordering and review of radiographic studies, pulse oximetry and re-evaluation of patient's condition.   Final Clinical Impressions(s) / ED Diagnoses   Final diagnoses:  Upper respiratory  tract infection, unspecified type  Chronic obstructive pulmonary disease, unspecified COPD type ([place])  Atrial fibrillation with rapid ventricular response [facility])    New Prescriptions New Prescriptions   No medications on file     Davonna Belling, MD 12/17/16 2231    Davonna Belling, MD 12/17/16 2302

## 2016-12-17 NOTE — H&P (Signed)
History and Physical    Bruce Webster LOV:[id number] DOB: 09-21-1922 DOA: 12/17/2016  PCP: Asencion Noble, MD  Patient coming from: Home  I have personally briefly reviewed patient's old medical records in [place]  Chief Complaint: Near Syncope  HPI: Bruce Webster is a 83 y.o. male with medical history significant of Bladder Cancer, HTN.  Patient lives alone but has people come and check on him.  Reportedly was sitting at home today and had near syncopal episode.  EMS called and initial SBP was in the 80s, improved to 90s with small amount of IVF.  Patient has had weight loss over the past couple of months, has also had persistent wet sounding cough over the past couple of months.  He was recently seen in ED for fall on 5/28.  Found to have UTI, initially treated with Keflex this was switched to Amoxicillin after cultures came back showing enterococcus.   ED Course: BP improved to [id number] systolic after 1L IVF.  Patient with new onset A.Fib with RVR 110s-120s.  CXR neg other than increased lung volumes.   Review of Systems: As per HPI otherwise 10 point review of systems negative.   Past Medical History:  Diagnosis Date  . Acute blood loss anemia 04/23/2015  . Acute kidney injury ([place]) 04/23/2015  . Cancer [facility])    Bladder  . Hypertension     Past Surgical History:  Procedure Laterality Date  . CHOLECYSTECTOMY    . EYE SURGERY     Cateract  . INTRAMEDULLARY (IM) NAIL INTERTROCHANTERIC Right 04/22/2015   Procedure: INTRAMEDULLARY (IM) NAIL RIGHT HIP;  Surgeon: Meredith Pel, MD;  Location: [facility];  Service: Orthopedics;  Laterality: Right;  . KIDNEY STONE SURGERY    . TONSILLECTOMY       reports that he quit smoking about 22 months ago. His smoking use included Cigarettes and Cigars. He smoked 0.25 packs per day. He has never used smokeless tobacco. He reports that he does not drink alcohol or use drugs.  No Known Allergies  No family history on file.  Non-contributory to admission of this elderly gentleman.  Does have sick contacts, friend who helps him out at home has URI.   Prior to Admission medications   Medication Sig Start Date End Date Taking? Authorizing Provider  cetirizine (ZYRTEC) 10 MG tablet Take 20 mg by mouth daily.   Yes [provider]  cholecalciferol (VITAMIN D) 1000 UNITS tablet Take 1,000 Units by mouth daily.   Yes [provider]  HYDROcodone-acetaminophen (NORCO/VICODIN) 5-325 MG tablet Take 1 tablet by mouth every 4 (four) hours as needed. Patient taking differently: Take 1 tablet by mouth every 4 (four) hours as needed for severe pain. Patient takes only 1/2 tablet for back pain 11/27/16  Yes Isla Pence, MD  lisinopril (PRINIVIL,ZESTRIL) 10 MG tablet Take 10 mg by mouth daily. 04/05/15  Yes [provider]    Physical Exam: Vitals:   12/17/16 2157 12/17/16 2200 12/17/16 2215 12/17/16 2300  BP: 102/70 96/83 107/90 107/68  Pulse: (!) 103 (!) 121 (!) 110   Resp: (!) 23 (!) 26 (!) 25 (!) 24  Temp:      TempSrc:      SpO2: 98% 100% 98%     Constitutional: NAD, calm, comfortable, emaciated and cachectic Eyes: PERRL, lids and conjunctivae normal ENMT: Mucous membranes are moist. Posterior pharynx clear of any exudate or lesions.Normal dentition.  Neck: normal, supple, no masses, no thyromegaly Respiratory: Wet sounding  cough, hoarse voice Cardiovascular: Irr, irr, slightly tachycardic Abdomen: no tenderness, no masses palpated. No hepatosplenomegaly. Bowel sounds positive.  Musculoskeletal: no clubbing / cyanosis. No joint deformity upper and lower extremities. Good ROM, no contractures. Normal muscle tone.  Skin: Decubitus on back but doesn't appear infected Neurologic: CN 2-12 grossly intact. Sensation intact, DTR normal. Strength 5/5 in all 4.  Psychiatric: Normal judgment and insight. Alert and oriented x 3. Normal mood.    Labs on Admission: I have personally reviewed  following labs and imaging studies  CBC:  Recent Labs Lab 12/17/16 2134 12/17/16 2156  WBC 12.6*  --   NEUTROABS 10.6*  --   HGB 13.2 13.6  HCT 41.2 40.0  MCV 98.8  --   PLT 225  --    Basic Metabolic Panel:  Recent Labs Lab 12/17/16 2134 12/17/16 2156  NA 138 138  K 4.7 4.6  CL 103 101  CO2 26  --   GLUCOSE 115* 114*  BUN 35* 36*  CREATININE 1.22 1.20  CALCIUM 9.2  --   MG 2.2  --    GFR: CrCl cannot be calculated (Unknown ideal weight.). Liver Function Tests:  Recent Labs Lab 12/17/16 2134  AST 18  ALT 17  ALKPHOS 102  BILITOT 0.5  PROT 6.4*  ALBUMIN 3.2*   No results for input(s): LIPASE, AMYLASE in the last 168 hours. No results for input(s): AMMONIA in the last 168 hours. Coagulation Profile: No results for input(s): INR, PROTIME in the last 168 hours. Cardiac Enzymes:  Recent Labs Lab 12/17/16 2134  TROPONINI 0.07*   BNP (last 3 results) No results for input(s): PROBNP in the last 8760 hours. HbA1C: No results for input(s): HGBA1C in the last 72 hours. CBG: No results for input(s): GLUCAP in the last 168 hours. Lipid Profile: No results for input(s): CHOL, HDL, LDLCALC, TRIG, CHOLHDL, LDLDIRECT in the last 72 hours. Thyroid Function Tests: No results for input(s): TSH, T4TOTAL, FREET4, T3FREE, THYROIDAB in the last 72 hours. Anemia Panel: No results for input(s): VITAMINB12, FOLATE, FERRITIN, TIBC, IRON, RETICCTPCT in the last 72 hours. Urine analysis:    Component Value Date/Time   COLORURINE YELLOW 11/27/2016 2106   APPEARANCEUR HAZY (A) 11/27/2016 2106   LABSPEC 1.015 11/27/2016 2106   PHURINE 5.0 11/27/2016 2106   GLUCOSEU NEGATIVE 11/27/2016 2106   HGBUR SMALL (A) 11/27/2016 2106   BILIRUBINUR NEGATIVE 11/27/2016 2106   KETONESUR 5 (A) 11/27/2016 2106   PROTEINUR 30 (A) 11/27/2016 2106   UROBILINOGEN 0.2 04/23/2015 0433   NITRITE NEGATIVE 11/27/2016 2106   LEUKOCYTESUR LARGE (A) 11/27/2016 2106    Radiological Exams on  Admission: Dg Chest Port 1 View  Result Date: 12/17/2016 CLINICAL DATA:  Cough and dizziness. Productive cough with clear sputum. EXAM: PORTABLE CHEST 1 VIEW COMPARISON:  11/27/2016 FINDINGS: Stable hyperinflation. Normal heart size with stable tortuosity and atherosclerosis of the thoracic aorta. No consolidation, pulmonary edema, pleural effusion or pneumothorax. Stable osseous structures. IMPRESSION: 1. No acute abnormality. 2. Stable hyperinflation suggesting COPD. 3. Atherosclerosis of the thoracic aorta. Electronically Signed   By: Jeb Levering M.D.   On: 12/17/2016 22:00    EKG: Independently reviewed.  Assessment/Plan Principal Problem:   Near syncope Active Problems:   HTN (hypertension)   New onset a-fib (HCC)   Atrial fibrillation with RVR (HCC)   Cough   Sacral decubitus ulcer    1. Near syncope - 1. With initial hypotension at scene that has improved with IVF 2. Syncope pathway  3. IVF: NS at 75 cc/hr 2. Cough - 1. Getting CT chest for cough that has been ongoing for several months with weight loss. 3. New onset A.Fib with RVR - 1. Per cardiology given borderline low BPs try metoprolol 12.5mg  PO BID first 2. 2d echo 3. Tele monitor 4. Heparin gtt for now 4. HTN - holding lisinopril due to borderline hypotension initially 5. Decubitus on back - wound care consult, doesn't appear infected though  DVT prophylaxis: Heparin gtt Code Status: Full code for now Family Communication: Family at bedside Disposition Plan: Home after admit Consults called: Cards Admission status: Place in obs   GARDNER, [place] Hospitalists Pager [phone]  If 7AM-7PM, please contact day team taking care of patient [email or web address] Password [facility]  12/17/2016, 11:25 PM

## 2016-12-17 NOTE — Progress Notes (Signed)
ANTICOAGULATION CONSULT NOTE - Initial Consult  Pharmacy Consult for Heparin Indication: atrial fibrillation  No Known Allergies  Patient Measurements:    Ht 69 in  Wt 45 kg Heparin Dosing Weight: 45 k  Vital Signs: Temp: 97.6 F (36.4 C) (06/17 2122) Temp Source: Oral (06/17 2122) BP: 96/83 (06/17 2200) Pulse Rate: 121 (06/17 2200)  Labs:  Recent Labs  12/17/16 2134 12/17/16 2156  HGB 13.2 13.6  HCT 41.2 40.0  PLT 225  --   CREATININE 1.22 1.20  TROPONINI 0.07*  --     CrCl cannot be calculated (Unknown ideal weight.).   Medical History: Past Medical History:  Diagnosis Date  . Acute blood loss anemia 04/23/2015  . Acute kidney injury ([place]) 04/23/2015  . Cancer [facility])    Bladder  . Hypertension     Medications:  See electronic med rec  Assessment: 83 y.o. M presents with afib. To begin heparin. No AC PTA. CBC ok on admission.  Goal of Therapy:  Heparin level 0.3-0.7 units/ml Monitor platelets by anticoagulation protocol: Yes   Plan:  Heparin IV bolus 3000 units Heparin gtt at 600 units/hr Will f/u heparin level in 8 hours Daily heparin level and CBC  Sherlon Handing, PharmD, BCPS Clinical pharmacist, pager [phone] 12/17/2016,11:09 PM

## 2016-12-17 NOTE — ED Notes (Addendum)
Back from CT

## 2016-12-17 NOTE — ED Triage Notes (Signed)
Per [facility],  Pt from home. Friend called 911 because pt reported dizziness. Pt reports feeling "fuzzy". EMS reports pt has pressure ulcer on bottom. Pt reports he was diagnosed with laryngitis. EMS reports initial BP off 80 systolic, after 480XK NS, BP increased to 111/50.  Pt reports productive cough, clear sputum. Breath sounds with coarse crackle on L side, diminished on R side. Pt denies chest pain. Pt alert and oriented.

## 2016-12-17 NOTE — Consult Note (Addendum)
Cardiology Consultation   Patient ID: Bruce Webster; [id number]; [birth year]   Admit date: 12/17/2016 Date of Consult: 12/17/2016  Referring Provider:  ED Alvino Chapel  Primary Care Provider: Asencion Noble, MD Cardiologist: NA Electrophysiologist:  NA  Reason for Consultation: afib  History of Present Illness: Bruce Webster is a 83 y.o. male who is being seen today for the evaluation of afib at the request of the [facility] ED attending. The ED was concerned that they could not treat the patient with cardizem gtt for rate control as his BP is on the low side so they requested our input for afib management. Pt has no cardiac hx and was brought to ED tonight by a friend due to being dizzy when the friend visited him earlier this evening. His friend says the patient was c/o feeling a little lightheaded late this afternoon and into the evening; the friend tried to take pt's BP with his own BP cuff, and when he was unable to get a reading, he called EMS. The patient did not c/o any worsening SOB or DOE, chest discomfort or palpitations. He says he has not noticed his heart racing or beating irregularly.  He has had some other medical issues recently; it sounds like he has lost about 8-10 pounds just over the past few weeks. In addition, he has completely lost his voice. He does have h/o bladder cancer but does not have an active CA diagnosis at this time.  The BP has come up into the low 497W systolic with IVF in the ED. Current HR at the time of my exam is in the low 100s.  Past Medical History:  Diagnosis Date  . Acute blood loss anemia 04/23/2015  . Acute kidney injury ([place]) 04/23/2015  . Cancer [facility])    Bladder  . Hypertension     Past Surgical History:  Procedure Laterality Date  . CHOLECYSTECTOMY    . EYE SURGERY     Cateract  . INTRAMEDULLARY (IM) NAIL INTERTROCHANTERIC Right 04/22/2015   Procedure: INTRAMEDULLARY (IM) NAIL RIGHT HIP;  Surgeon: Meredith Pel, MD;  Location: [place] [facility];   Service: Orthopedics;  Laterality: Right;  . KIDNEY STONE SURGERY    . TONSILLECTOMY      No current facility-administered medications on file prior to encounter.    Current Outpatient Prescriptions on File Prior to Encounter  Medication Sig Dispense Refill  . cetirizine (ZYRTEC) 10 MG tablet Take 20 mg by mouth daily.    . cholecalciferol (VITAMIN D) 1000 UNITS tablet Take 1,000 Units by mouth daily.    [name] HYDROcodone-acetaminophen (NORCO/VICODIN) 5-325 MG tablet Take 1 tablet by mouth every 4 (four) hours as needed. (Patient taking differently: Take 1 tablet by mouth every 4 (four) hours as needed for severe pain. Patient takes only 1/2 tablet for back pain) 10 tablet 0  . lisinopril (PRINIVIL,ZESTRIL) 10 MG tablet Take 10 mg by mouth daily.       Current Medications: . iopamidol      . [START ON 12/18/2016] loratadine  10 mg Oral Daily  . metoprolol tartrate  12.5 mg Oral BID  . sodium chloride flush  3 mL Intravenous Q12H    Infused Medications: . sodium chloride    . sodium chloride 500 mL (12/17/16 2248)    PRN Medications: HYDROcodone-acetaminophen   Allergies:   No Known Allergies  Social History:   The patient  reports that he quit smoking about 22 months ago. His smoking use included Cigarettes and Cigars. He  smoked 0.25 packs per day. He has never used smokeless tobacco. He reports that he does not drink alcohol or use drugs.    Family History:   The patient's family history is not on file.   ROS:  Please see the history of present illness.  + recent weight loss, + loss of voice, +cough All other ROS reviewed and negative.     Vital Signs: Blood pressure 107/68, pulse (!) 110, temperature 97.6 F (36.4 C), temperature source Oral, resp. rate (!) 24, SpO2 98 %.   PHYSICAL EXAM: General:  Pt is cachetic and ill-appearing, in no acute distress HEENT: normal Lymph: no adenopathy Neck: no JVD Endocrine:  No thryomegaly Vascular: No carotid bruits; DP pulses 2+  bilaterally  Cardiac:  normal S1, S2; irreg irreg; no murmur. Ext are warm and well-perfused w/o significant edema Lungs:  BS decreased bilaterally; course rhonchi bilaterally  Abd: soft, nontender, no hepatomegaly  Musculoskeletal:  No deformities, BUE and BLE strength normal and equal Skin: warm and dry  Neuro:  CNs 2-12 intact, no focal abnormalities noted Psych:  Normal affect   EKG:  afib w/ HR 111  Labs:  Recent Labs  12/17/16 2134  TROPONINI 0.07*   No results for input(s): TROPIPOC in the last 72 hours.  Lab Results  Component Value Date   WBC 12.6 (H) 12/17/2016   HGB 13.6 12/17/2016   HCT 40.0 12/17/2016   MCV 98.8 12/17/2016   PLT 225 12/17/2016    Recent Labs Lab 12/17/16 2134 12/17/16 2156  NA 138 138  K 4.7 4.6  CL 103 101  CO2 26  --   BUN 35* 36*  CREATININE 1.22 1.20  CALCIUM 9.2  --   PROT 6.4*  --   BILITOT 0.5  --   ALKPHOS 102  --   ALT 17  --   AST 18  --   GLUCOSE 115* 114*   No results found for: CHOL, HDL, LDLCALC, TRIG No results found for: DDIMER  Radiology/Studies:  Dg Chest Port 1 View  Result Date: 12/17/2016 CLINICAL DATA:  Cough and dizziness. Productive cough with clear sputum. EXAM: PORTABLE CHEST 1 VIEW COMPARISON:  11/27/2016 FINDINGS: Stable hyperinflation. Normal heart size with stable tortuosity and atherosclerosis of the thoracic aorta. No consolidation, pulmonary edema, pleural effusion or pneumothorax. Stable osseous structures. IMPRESSION: 1. No acute abnormality. 2. Stable hyperinflation suggesting COPD. 3. Atherosclerosis of the thoracic aorta. Electronically Signed   By: Jeb Levering M.D.   On: 12/17/2016 22:00    ASSESSMENT AND PLAN:  1. Afib: it is hard to tell if pt is symptomatic from the afib. He has a lot of other issues going on from a medical standpoint. In regard to the afib I would be as conservative as possible at this time until the total clinical picture is a little clearer. Fine to use BB for rate  control; could start with lopressor 12.5mg  BID or q6h if needed to keep HR ~100 or less. This should drop the pressure less than CCB. Can use heparin IV gtt for short-term CVA prophylaxis but it is doubtful that this pt will be a candidate for long-term anti-coagulation (i see him as probable falls risk) although his CHADS score would qualify him to receive it. Can get TTE to assess LV function and cardiac structure.  I would not get aggressive with a rhythm control strategy unless it becomes clear that pt is symptomatic from the afib or rate control is a problem  2. HTN: would hold lisinopril for now to give a little extra BP to allow for rate control  Thank you for the opportunity to participate in the care of this very pleasant patient. Will follow. Please call w/ questions.   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 The mass measures approximately 8 x 6 cm. This mass also abuts 180 degrees of both left pulmonary veins. The mass encases the left pulmonary artery and narrows the proximal left upper and lower lobar arteries. There is another upper mediastinal mass that abuts the superior vena cava, markedly narrowing at, and also abuts the brachiocephalic artery. This mass measures 3.2 x 2.6 cm.  Lungs/Pleura: Severe emphysema. 7 mm right middle lobe nodule, 4 mm right upper lobe nodule, 6 mm right lower lobe perifissural nodule. A pleural-based left lower lobe nodule measures 5 mm. No pleural effusion. There is debris layering within the lower trachea.  Upper Abdomen: Severe bilateral renal cortical thinning. Right upper pole renal cyst measures 3 cm. The liver is heterogeneous with a nodular contour and multiple areas of hypoattenuation.  Concerning for possible  metastatic lung CA. Would recommend conservative tx of the afib Rudean Curt, MD , [facility] 12:59 AM

## 2016-12-18 DIAGNOSIS — I482 Chronic atrial fibrillation: Secondary | ICD-10-CM | POA: Diagnosis not present

## 2016-12-18 DIAGNOSIS — C801 Malignant (primary) neoplasm, unspecified: Secondary | ICD-10-CM | POA: Diagnosis not present

## 2016-12-18 DIAGNOSIS — R55 Syncope and collapse: Secondary | ICD-10-CM | POA: Diagnosis not present

## 2016-12-18 DIAGNOSIS — L89152 Pressure ulcer of sacral region, stage 2: Secondary | ICD-10-CM | POA: Diagnosis not present

## 2016-12-18 DIAGNOSIS — I248 Other forms of acute ischemic heart disease: Secondary | ICD-10-CM | POA: Diagnosis present

## 2016-12-18 DIAGNOSIS — B952 Enterococcus as the cause of diseases classified elsewhere: Secondary | ICD-10-CM

## 2016-12-18 DIAGNOSIS — R498 Other voice and resonance disorders: Secondary | ICD-10-CM | POA: Diagnosis not present

## 2016-12-18 DIAGNOSIS — E43 Unspecified severe protein-calorie malnutrition: Secondary | ICD-10-CM

## 2016-12-18 DIAGNOSIS — R41841 Cognitive communication deficit: Secondary | ICD-10-CM | POA: Diagnosis not present

## 2016-12-18 DIAGNOSIS — Z7189 Other specified counseling: Secondary | ICD-10-CM | POA: Diagnosis not present

## 2016-12-18 DIAGNOSIS — N390 Urinary tract infection, site not specified: Secondary | ICD-10-CM | POA: Diagnosis not present

## 2016-12-18 DIAGNOSIS — Z87891 Personal history of nicotine dependence: Secondary | ICD-10-CM | POA: Diagnosis not present

## 2016-12-18 DIAGNOSIS — R05 Cough: Secondary | ICD-10-CM | POA: Diagnosis not present

## 2016-12-18 DIAGNOSIS — J069 Acute upper respiratory infection, unspecified: Secondary | ICD-10-CM | POA: Diagnosis not present

## 2016-12-18 DIAGNOSIS — R627 Adult failure to thrive: Secondary | ICD-10-CM | POA: Diagnosis not present

## 2016-12-18 DIAGNOSIS — R918 Other nonspecific abnormal finding of lung field: Secondary | ICD-10-CM

## 2016-12-18 DIAGNOSIS — Z7982 Long term (current) use of aspirin: Secondary | ICD-10-CM | POA: Diagnosis not present

## 2016-12-18 DIAGNOSIS — Z515 Encounter for palliative care: Secondary | ICD-10-CM | POA: Diagnosis not present

## 2016-12-18 DIAGNOSIS — R1310 Dysphagia, unspecified: Secondary | ICD-10-CM | POA: Diagnosis not present

## 2016-12-18 DIAGNOSIS — I480 Paroxysmal atrial fibrillation: Secondary | ICD-10-CM | POA: Diagnosis present

## 2016-12-18 DIAGNOSIS — R0600 Dyspnea, unspecified: Secondary | ICD-10-CM | POA: Diagnosis not present

## 2016-12-18 DIAGNOSIS — I4891 Unspecified atrial fibrillation: Secondary | ICD-10-CM | POA: Diagnosis not present

## 2016-12-18 DIAGNOSIS — Q333 Agenesis of lung: Secondary | ICD-10-CM | POA: Diagnosis not present

## 2016-12-18 DIAGNOSIS — Z66 Do not resuscitate: Secondary | ICD-10-CM | POA: Diagnosis present

## 2016-12-18 DIAGNOSIS — I360 Nonrheumatic tricuspid (valve) stenosis: Secondary | ICD-10-CM | POA: Diagnosis not present

## 2016-12-18 DIAGNOSIS — I10 Essential (primary) hypertension: Secondary | ICD-10-CM | POA: Diagnosis not present

## 2016-12-18 DIAGNOSIS — M6281 Muscle weakness (generalized): Secondary | ICD-10-CM | POA: Diagnosis not present

## 2016-12-18 DIAGNOSIS — L89159 Pressure ulcer of sacral region, unspecified stage: Secondary | ICD-10-CM | POA: Diagnosis present

## 2016-12-18 DIAGNOSIS — Z79899 Other long term (current) drug therapy: Secondary | ICD-10-CM | POA: Diagnosis not present

## 2016-12-18 DIAGNOSIS — E440 Moderate protein-calorie malnutrition: Secondary | ICD-10-CM | POA: Diagnosis not present

## 2016-12-18 DIAGNOSIS — Z8551 Personal history of malignant neoplasm of bladder: Secondary | ICD-10-CM | POA: Diagnosis not present

## 2016-12-18 DIAGNOSIS — Z681 Body mass index (BMI) 19 or less, adult: Secondary | ICD-10-CM | POA: Diagnosis not present

## 2016-12-18 DIAGNOSIS — C799 Secondary malignant neoplasm of unspecified site: Secondary | ICD-10-CM | POA: Diagnosis not present

## 2016-12-18 DIAGNOSIS — R2681 Unsteadiness on feet: Secondary | ICD-10-CM | POA: Diagnosis not present

## 2016-12-18 LAB — BASIC METABOLIC PANEL
ANION GAP: 8 (ref 5–15)
BUN: 33 mg/dL — ABNORMAL HIGH (ref 6–20)
CALCIUM: 8.4 mg/dL — AB (ref 8.9–10.3)
CO2: 25 mmol/L (ref 22–32)
Chloride: 104 mmol/L (ref 101–111)
Creatinine, Ser: 1.08 mg/dL (ref 0.61–1.24)
GFR, EST NON AFRICAN AMERICAN: 57 mL/min — AB (ref >60)
Glucose, Bld: 86 mg/dL (ref 65–99)
POTASSIUM: 4.4 mmol/L (ref 3.5–5.1)
Sodium: 137 mmol/L (ref 135–145)

## 2016-12-18 LAB — CBC
HCT: 37.7 % — ABNORMAL LOW (ref 39.0–52.0)
HEMOGLOBIN: 11.7 g/dL — AB (ref 13.0–17.0)
MCH: 31.1 pg (ref 26.0–34.0)
MCHC: 31 g/dL (ref 30.0–36.0)
MCV: 100.3 fL — AB (ref 78.0–100.0)
Platelets: 204 10*3/uL (ref 150–400)
RBC: 3.76 MIL/uL — AB (ref 4.22–5.81)
RDW: 15.1 % (ref 11.5–15.5)
WBC: 11 10*3/uL — AB (ref 4.0–10.5)

## 2016-12-18 LAB — URINALYSIS, ROUTINE W REFLEX MICROSCOPIC
Bilirubin Urine: NEGATIVE
GLUCOSE, UA: NEGATIVE mg/dL
HGB URINE DIPSTICK: NEGATIVE
Ketones, ur: NEGATIVE mg/dL
NITRITE: NEGATIVE
Protein, ur: NEGATIVE mg/dL
SPECIFIC GRAVITY, URINE: 1.036 — AB (ref 1.005–1.030)
pH: 6 (ref 5.0–8.0)

## 2016-12-18 LAB — CBG MONITORING, ED
GLUCOSE-CAPILLARY: 57 mg/dL — AB (ref 65–99)
GLUCOSE-CAPILLARY: 61 mg/dL — AB (ref 65–99)
GLUCOSE-CAPILLARY: 89 mg/dL (ref 65–99)

## 2016-12-18 LAB — MRSA PCR SCREENING: MRSA by PCR: NEGATIVE

## 2016-12-18 LAB — TROPONIN I
Troponin I: 0.06 ng/mL (ref <0.03)
Troponin I: 0.07 ng/mL (ref <0.03)
Troponin I: 0.13 ng/mL (ref <0.03)

## 2016-12-18 LAB — HEPARIN LEVEL (UNFRACTIONATED): Heparin Unfractionated: 0.3 IU/mL (ref 0.30–0.70)

## 2016-12-18 MED ORDER — DEXTROSE 5 % IV SOLN
1.0000 g | Freq: Every day | INTRAVENOUS | Status: DC
  Administered 2016-12-18: 1 g via INTRAVENOUS
  Filled 2016-12-18: qty 10

## 2016-12-18 MED ORDER — BENZONATATE 100 MG PO CAPS
200.0000 mg | ORAL_CAPSULE | Freq: Three times a day (TID) | ORAL | Status: DC | PRN
  Administered 2016-12-21: 200 mg via ORAL
  Filled 2016-12-18: qty 2

## 2016-12-18 MED ORDER — ASPIRIN 325 MG PO TABS
325.0000 mg | ORAL_TABLET | Freq: Every day | ORAL | Status: DC
Start: 2016-12-18 — End: 2016-12-21
  Administered 2016-12-18 – 2016-12-21 (×4): 325 mg via ORAL
  Filled 2016-12-18 (×4): qty 1

## 2016-12-18 MED ORDER — BOOST / RESOURCE BREEZE PO LIQD
1.0000 | Freq: Three times a day (TID) | ORAL | Status: DC
  Administered 2016-12-18 – 2016-12-21 (×6): 1 via ORAL

## 2016-12-18 MED ORDER — COLLAGENASE 250 UNIT/GM EX OINT
TOPICAL_OINTMENT | Freq: Every day | CUTANEOUS | Status: DC
  Administered 2016-12-18 – 2016-12-21 (×4): via TOPICAL
  Filled 2016-12-18: qty 30

## 2016-12-18 MED ORDER — SODIUM CHLORIDE 0.9 % IV SOLN
3.0000 g | Freq: Two times a day (BID) | INTRAVENOUS | Status: DC
  Administered 2016-12-18 – 2016-12-21 (×7): 3 g via INTRAVENOUS
  Filled 2016-12-18 (×8): qty 3

## 2016-12-18 MED ORDER — SODIUM CHLORIDE 0.9 % IV BOLUS (SEPSIS)
500.0000 mL | Freq: Once | INTRAVENOUS | Status: AC
  Administered 2016-12-18: 500 mL via INTRAVENOUS

## 2016-12-18 NOTE — Progress Notes (Signed)
ANTICOAGULATION CONSULT NOTE Pharmacy Consult for Heparin Indication: atrial fibrillation  No Known Allergies  Patient Measurements: Height: 6' (182.9 cm) Weight: 103 lb (46.7 kg) IBW/kg (Calculated) : 77.6  Ht 69 in  Wt 45 kg Heparin Dosing Weight: 45 k  Vital Signs: Temp: 97.8 F (36.6 C) (06/18 0912) Temp Source: Oral (06/18 0912) BP: 113/86 (06/18 0912) Pulse Rate: 71 (06/18 0912)  Labs:  Recent Labs  12/17/16 2134 12/17/16 2156 12/17/16 2359 12/18/16 0519 12/18/16 0851  HGB 13.2 13.6  --  11.7*  --   HCT 41.2 40.0  --  37.7*  --   PLT 225  --   --  204  --   HEPARINUNFRC  --   --   --   --  0.30  CREATININE 1.22 1.20  --  1.08  --   TROPONINI 0.07*  --  0.13* 0.07*  --     Estimated Creatinine Clearance: 28.8 mL/min (by C-G formula based on SCr of 1.08 mg/dL).   Medical History: Past Medical History:  Diagnosis Date  . Acute blood loss anemia 04/23/2015  . Acute kidney injury ([place]) 04/23/2015  . Cancer [facility])    Bladder  . Hypertension     Medications:  Heparin infusion at 600 units/hr  Assessment: 83 y.o. M presents with afib started on heparin. Initial heparin is therapeutic at 0.30. CBC stable. Not likely a long term anticoagulation per cardiology.   Goal of Therapy:  Heparin level 0.3-0.7 units/ml Monitor platelets by anticoagulation protocol: Yes   Plan:  1. Continue heparin infusion at 600 units/hr 2. Confirmatory heparin level in 8 hours if heparin continued  3. Daily heparin level and CBC 4. Follow up on long term anticoagulation plans   Vincenza Hews, PharmD, BCPS 12/18/2016, 10:10 AM

## 2016-12-18 NOTE — ED Notes (Signed)
Pt CBG was 89, notified Hope(RN)

## 2016-12-18 NOTE — Progress Notes (Signed)
Called to pts room by nurse tech, pts IV found laying on the floor, pt unable think how IV came out, answers all orientation questions appropriately, will continue to monitor closely.  Edward Qualia RN

## 2016-12-18 NOTE — ED Notes (Signed)
CBG 61, RN notified.

## 2016-12-18 NOTE — Progress Notes (Signed)
Pt expressing interest in changing code to DNR, Dr Dyann Kief notified.  Edward Qualia RN

## 2016-12-18 NOTE — Progress Notes (Signed)
[facility] PROGRESS NOTE  Bruce Webster YSA:[id number] DOB: [birth year] DOA: 12/17/2016 PCP: Asencion Noble, MD  Interim summary and HPI 83 y/o with hx of bladder cancer, HTN and prior tobacco abuse. Presented with near syncope event. Found to have A. Fib with RVR and also UTI. Given concomitant complaints of cough and weight loss for multiple months now; CT scan chest was done and demonstrated lung mass.  Assessment/Plan: 1-near syncope: feeling weak and tired. New A. Fib -patient with A. Fib with RVR -converted back to sinus after treatment with metoprolol and IVF's -will monitor on telemetry -CHADsVASC score 3; but patient at high risk for falls. Per cardiology not a candidate for anticoagulation. -will use ASA and continue metoprolol 12.5 mg BID -will follow 2-D echo  2-lung mass: seen on CT scan -new finding and suggesting lung cancer -after long discussion with patient he will like to treat himself conservatively and no further work up or intervention to be done for this -will have palliative to assist with GOC and advance directives -patient expressed wishes of been DNR -patient with hx of weight loss and chronic cough  3-acute UTI: with prior cx showing enterococcus faecalis  -will d/c rocephin -will treat with unasyn  -continue IVF's and supportive care  4-severe protein calorie malnutrition -will use boost TID between meals -Body mass index is 13.97 kg/m.  5-HTN -BP stable and soft -will d/c norvasc and lisinopril -continue only metoprolol  6-pressure injury and inner buttocks wounds -will follow [place] service recommendations -continue preventive measures   7-elevated troponin -demand ischemia from A. Fib -no CP -will follow echo -continue ASA -no further evaluation for cardiac disease.  Code Status: DNR/DNI Family Communication: no family at bedside  Disposition Plan: to be determine. But patient was living alone by himself; no family and in very  unkept situation. Also with new findings concerning for lung cancer and new diagnosis of A. Fib.   Consultants:  Cardiology   Procedures:  See below for x-ray reports  2-D echo: pending   Antibiotics:  Unasyn 6/18  HPI/Subjective: Afebrile, no CP or palpitations reported. Patient feels that his breathing is better. patietn urine incontinence and frequency; denies dysuria.   Objective: Vitals:   12/18/16 0912 12/18/16 1700  BP: 113/86   Pulse: 71   Resp: 18   Temp: 97.8 F (36.6 C) 97.6 F (36.4 C)    Intake/Output Summary (Last 24 hours) at 12/18/16 1931 Last data filed at 12/18/16 1800  Gross per 24 hour  Intake          2824.35 ml  Output              150 ml  Net          2674.35 ml   Filed Weights   12/18/16 0912  Weight: 46.7 kg (103 lb)    Exam:   General: afebrile, no CP and reports breathing is improved. Patient is cachetic, unkept, with poor dentition and chronically ill in appearance. Very hard of hearing. Oriented X 3. Reports no dysuria, but with positive frequency.  Cardiovascular: S1 an S2, no rubs, no gallops; positive SEM  Respiratory: no crackles, no wheezing, fair movement, positive rhonchi  Abdomen: soft, NT, ND, positive BS  Musculoskeletal: no cyanosis or clubbing  Skin: patient with mid back and lower back stage 1 and stage 2 pressure ulcers; also with inner buttocks wounds, no sourrounding erythema and with mild yellowish sloug.  Data Reviewed: Basic Metabolic Panel:  Recent Labs Lab  12/17/16 2134 12/17/16 2156 12/18/16 0519  NA 138 138 137  K 4.7 4.6 4.4  CL 103 101 104  CO2 26  --  25  GLUCOSE 115* 114* 86  BUN 35* 36* 33*  CREATININE 1.22 1.20 1.08  CALCIUM 9.2  --  8.4*  MG 2.2  --   --    Liver Function Tests:  Recent Labs Lab 12/17/16 2134  AST 18  ALT 17  ALKPHOS 102  BILITOT 0.5  PROT 6.4*  ALBUMIN 3.2*   CBC:  Recent Labs Lab 12/17/16 2134 12/17/16 2156 12/18/16 0519  WBC 12.6*  --  11.0*   NEUTROABS 10.6*  --   --   HGB 13.2 13.6 11.7*  HCT 41.2 40.0 37.7*  MCV 98.8  --  100.3*  PLT 225  --  204   Cardiac Enzymes:  Recent Labs Lab 12/17/16 2134 12/17/16 2359 12/18/16 0519 12/18/16 1157  TROPONINI 0.07* 0.13* 0.07* 0.06*   CBG:  Recent Labs Lab 12/18/16 0633 12/18/16 0655 12/18/16 0734  GLUCAP 57* 61* 89    Recent Results (from the past 240 hour(s))  MRSA PCR Screening     Status: None   Collection Time: 12/18/16 10:35 AM  Result Value Ref Range Status   MRSA by PCR NEGATIVE NEGATIVE Final    Comment:        The GeneXpert MRSA Assay (FDA approved for NASAL specimens only), is one component of a comprehensive MRSA colonization surveillance program. It is not intended to diagnose MRSA infection nor to guide or monitor treatment for MRSA infections.      Studies: Ct Chest W Contrast  Result Date: 12/18/2016 CLINICAL DATA:  Cough.  History of hypertension and cancer. EXAM: CT CHEST WITH CONTRAST TECHNIQUE: Multidetector CT imaging of the chest was performed during intravenous contrast administration. CONTRAST:  19mL ISOVUE-300 IOPAMIDOL (ISOVUE-300) INJECTION 61% COMPARISON:  Chest radiograph 12/17/2016 FINDINGS: Cardiovascular: There is extensive atherosclerotic plaque throughout the thoracic aorta. The heart size is normal. There are coronary artery calcifications. Mediastinum/Nodes: There is a large left hilar/ mediastinal mass encasing the distal trachea and left mainstem bronchus and extending along the left lower lobe bronchus. The mass measures approximately 8 x 6 cm. This mass also abuts 180 degrees of both left pulmonary veins. The mass encases the left pulmonary artery and narrows the proximal left upper and lower lobar arteries. There is another upper mediastinal mass that abuts the superior vena cava, markedly narrowing at, and also abuts the brachiocephalic artery. This mass measures 3.2 x 2.6 cm. Lungs/Pleura: Severe emphysema. 7 mm right  middle lobe nodule, 4 mm right upper lobe nodule, 6 mm right lower lobe perifissural nodule. A pleural-based left lower lobe nodule measures 5 mm. No pleural effusion. There is debris layering within the lower trachea. Upper Abdomen: Severe bilateral renal cortical thinning. Right upper pole renal cyst measures 3 cm. The liver is heterogeneous with a nodular contour and multiple areas of hypoattenuation. Musculoskeletal: Multilevel thoracic osteophytosis. No focal lytic lesions are identified. There is an old fracture of the right eleventh rib. IMPRESSION: 1. Large mediastinal and left hilar mass encasing the lower trachea, left mainstem bronchus and left lower lobe bronchus. The mass encases the left main pulmonary artery and left upper and lower lobar arteries. The mass also abuts the right main pulmonary artery, right upper lobar artery and both left pulmonary veins. 2. Right upper mediastinal mass compressing the superior vena cava and abutting the right brachiocephalic artery. 3. Multiple right-sided pulmonary  nodules, concerning for neoplastic/metastatic lesions. At a minimum, follow-up chest CT in 3 months is recommended. 4. Heterogeneous appearance of the liver with multiple areas of hypoattenuation. Hepatic metastatic disease would be difficult to exclude. This could be further evaluated with MRI, if clinically indicated. 5. Aortic Atherosclerosis (ICD10-I70.0) and Emphysema (ICD10-J43.9). Electronically Signed   By: Ulyses Jarred M.D.   On: 12/18/2016 00:07   Dg Chest Port 1 View  Result Date: 12/17/2016 CLINICAL DATA:  Cough and dizziness. Productive cough with clear sputum. EXAM: PORTABLE CHEST 1 VIEW COMPARISON:  11/27/2016 FINDINGS: Stable hyperinflation. Normal heart size with stable tortuosity and atherosclerosis of the thoracic aorta. No consolidation, pulmonary edema, pleural effusion or pneumothorax. Stable osseous structures. IMPRESSION: 1. No acute abnormality. 2. Stable hyperinflation  suggesting COPD. 3. Atherosclerosis of the thoracic aorta. Electronically Signed   By: Jeb Levering M.D.   On: 12/17/2016 22:00    Scheduled Meds: . collagenase   Topical Daily  . loratadine  10 mg Oral Daily  . metoprolol tartrate  12.5 mg Oral BID  . sodium chloride flush  3 mL Intravenous Q12H   Continuous Infusions: . sodium chloride 75 mL/hr at 12/18/16 1553  . ampicillin-sulbactam (UNASYN) IV Stopped (12/18/16 1121)  . heparin 600 Units/hr (12/18/16 0024)    Principal Problem:   Near syncope Active Problems:   HTN (hypertension)   New onset a-fib (HCC)   Atrial fibrillation with RVR (HCC)   Cough  lumbar/thoracic pressure decubitus ulcer    Time spent: 35 minutes    Barton Dubois  [facility] Pager [phone]. If 7PM-7AM, please contact night-coverage at [email or web address], password [facility] 12/18/2016, 7:31 PM  LOS: 0 days

## 2016-12-18 NOTE — Progress Notes (Signed)
Overnight update: 1) CT scan demonstrates neoplasm in chest, did give patient the news of this finding. 2) UA shows UTI: rocephin ordered 3) BP running a bit on the low side (35C systolic).  Ordered 500 cc bolus.  SBP already up from 80s to 107 at this time with less than 100ccs of bolus in. 4) A.Fib now rate controlled with rate in the 70s.

## 2016-12-18 NOTE — ED Notes (Signed)
Changed pt's gown and linens after pt had BM.

## 2016-12-18 NOTE — ED Notes (Signed)
Bruce Webster at the bedside.  Made aware of low blood pressure and variable oxygen saturation.

## 2016-12-18 NOTE — Consult Note (Signed)
[facility] Nurse wound consult note Reason for Consult: Consult requested for buttocks and back wounds.   Inner buttocks with 2 full thickness wounds of unknown etiology; .3X.3X.1cm and .5X.5X.1cm; both areas with yellow slough, small amt tan drainage, no odor.  These are NOT pressure injuries; not located over a bony prominence. It is difficult to keep wounds from becoming soiled since he is incontinent of stool and rectum is located very close to the wounds. Middle back with stage 1 pressure injury; 1X1cm, red and non blanching. Middle back with stage 2 pressure injury; .3X.3X.1cm, pink and dry, no odor or drainage.  Pt has protruding back bones over the affected areas and is very emaciated.   Pressure Injury POA: Yes Dressing procedure/placement/frequency: Foam dressing to protect and promote healing to back wounds.  Santyl to provide enzymatic debridement to inner buttock wounds.  Discussed plan of care with patient. Please re-consult if further assistance is needed.  Thank-you,  Julien Girt MSN, [place], [place], [place], [place]

## 2016-12-18 NOTE — ED Notes (Signed)
Blood glucose noted to be 57.  Drinking Orange juice at this time.

## 2016-12-18 NOTE — Progress Notes (Signed)
Progress Note  Patient Name: Bruce Webster Date of Encounter: 12/18/2016  Primary Cardiologist: New  Subjective   No chest pain or dyspnea  Inpatient Medications    Scheduled Meds: . loratadine  10 mg Oral Daily  . metoprolol tartrate  12.5 mg Oral BID  . sodium chloride flush  3 mL Intravenous Q12H   Continuous Infusions: . sodium chloride 75 mL/hr at 12/18/16 0025  . heparin 600 Units/hr (12/18/16 0024)   PRN Meds: benzonatate, HYDROcodone-acetaminophen   Vital Signs    Vitals:   12/18/16 0715 12/18/16 0800 12/18/16 0830 12/18/16 0912  BP: (!) 119/56 105/85 122/63 113/86  Pulse: (!) 58 (!) 112 69 71  Resp: 13 16 (!) 23 18  Temp:    97.8 F (36.6 C)  TempSrc:    Oral  SpO2: 100% 95% 100% 100%  Weight:    46.7 kg (103 lb)  Height:    6' (1.829 m)    Intake/Output Summary (Last 24 hours) at 12/18/16 1008 Last data filed at 12/17/16 2348  Gross per 24 hour  Intake             1000 ml  Output                0 ml  Net             1000 ml   Filed Weights   12/18/16 0912  Weight: 46.7 kg (103 lb)    Telemetry    Back in sinus - Personally Reviewed   Physical Exam   GEN: Elderly, frail; No acute distress.   Neck: supple Cardiac: RRR Respiratory: Diffuse rhonchi GI: Soft, nontender, non-distended  MS: No edema Neuro:  Nonfocal  Psych: Normal affect   Labs    Chemistry Recent Labs Lab 12/17/16 2134 12/17/16 2156 12/18/16 0519  NA 138 138 137  K 4.7 4.6 4.4  CL 103 101 104  CO2 26  --  25  GLUCOSE 115* 114* 86  BUN 35* 36* 33*  CREATININE 1.22 1.20 1.08  CALCIUM 9.2  --  8.4*  PROT 6.4*  --   --   ALBUMIN 3.2*  --   --   AST 18  --   --   ALT 17  --   --   ALKPHOS 102  --   --   BILITOT 0.5  --   --   GFRNONAA 50*  --  57*  GFRAA 57*  --  >60  ANIONGAP 9  --  8     Hematology Recent Labs Lab 12/17/16 2134 12/17/16 2156 12/18/16 0519  WBC 12.6*  --  11.0*  RBC 4.17*  --  3.76*  HGB 13.2 13.6 11.7*  HCT 41.2 40.0 37.7*   MCV 98.8  --  100.3*  MCH 31.7  --  31.1  MCHC 32.0  --  31.0  RDW 14.6  --  15.1  PLT 225  --  204    Cardiac Enzymes Recent Labs Lab 12/17/16 2134 12/17/16 2359 12/18/16 0519  TROPONINI 0.07* 0.13* 0.07*    Radiology    Ct Chest W Contrast  Result Date: 12/18/2016 CLINICAL DATA:  Cough.  History of hypertension and cancer. EXAM: CT CHEST WITH CONTRAST TECHNIQUE: Multidetector CT imaging of the chest was performed during intravenous contrast administration. CONTRAST:  36mL ISOVUE-300 IOPAMIDOL (ISOVUE-300) INJECTION 61% COMPARISON:  Chest radiograph 12/17/2016 FINDINGS: Cardiovascular: There is extensive atherosclerotic plaque throughout the thoracic aorta. The heart size is normal. There  are coronary artery calcifications. Mediastinum/Nodes: There is a large left hilar/ mediastinal mass encasing the distal trachea and left mainstem bronchus and extending along the left lower lobe bronchus. The mass measures approximately 8 x 6 cm. This mass also abuts 180 degrees of both left pulmonary veins. The mass encases the left pulmonary artery and narrows the proximal left upper and lower lobar arteries. There is another upper mediastinal mass that abuts the superior vena cava, markedly narrowing at, and also abuts the brachiocephalic artery. This mass measures 3.2 x 2.6 cm. Lungs/Pleura: Severe emphysema. 7 mm right middle lobe nodule, 4 mm right upper lobe nodule, 6 mm right lower lobe perifissural nodule. A pleural-based left lower lobe nodule measures 5 mm. No pleural effusion. There is debris layering within the lower trachea. Upper Abdomen: Severe bilateral renal cortical thinning. Right upper pole renal cyst measures 3 cm. The liver is heterogeneous with a nodular contour and multiple areas of hypoattenuation. Musculoskeletal: Multilevel thoracic osteophytosis. No focal lytic lesions are identified. There is an old fracture of the right eleventh rib. IMPRESSION: 1. Large mediastinal and left  hilar mass encasing the lower trachea, left mainstem bronchus and left lower lobe bronchus. The mass encases the left main pulmonary artery and left upper and lower lobar arteries. The mass also abuts the right main pulmonary artery, right upper lobar artery and both left pulmonary veins. 2. Right upper mediastinal mass compressing the superior vena cava and abutting the right brachiocephalic artery. 3. Multiple right-sided pulmonary nodules, concerning for neoplastic/metastatic lesions. At a minimum, follow-up chest CT in 3 months is recommended. 4. Heterogeneous appearance of the liver with multiple areas of hypoattenuation. Hepatic metastatic disease would be difficult to exclude. This could be further evaluated with MRI, if clinically indicated. 5. Aortic Atherosclerosis (ICD10-I70.0) and Emphysema (ICD10-J43.9). Electronically Signed   By: Ulyses Jarred M.D.   On: 12/18/2016 00:07   Dg Chest Port 1 View  Result Date: 12/17/2016 CLINICAL DATA:  Cough and dizziness. Productive cough with clear sputum. EXAM: PORTABLE CHEST 1 VIEW COMPARISON:  11/27/2016 FINDINGS: Stable hyperinflation. Normal heart size with stable tortuosity and atherosclerosis of the thoracic aorta. No consolidation, pulmonary edema, pleural effusion or pneumothorax. Stable osseous structures. IMPRESSION: 1. No acute abnormality. 2. Stable hyperinflation suggesting COPD. 3. Atherosclerosis of the thoracic aorta. Electronically Signed   By: Jeb Levering M.D.   On: 12/17/2016 22:00    Patient Profile     83 y.o. male admitted with weakness and dizziness and noted to be in atrial fibrillation. Additional issues include weight loss. Chest CT shows large mediastinal and left hilar mass and right upper mediastinal mass. Multiple right-sided pulmonary nodules.  Assessment & Plan    1 paroxysmal atrial fibrillation-patient has converted to sinus rhythm. CHADSvasc 3. However given his age and fall risk as well as newly diagnosed  mediastinal/hilar mass and do not feel he is a candidate for anticoagulation long-term. Continue metoprolol 12.5 mg twice a day. Await echocardiogram.  2 lung mass-further evaluation per primary service.  3 weakness/dizziness-he appears to be prerenal. I agree with continuing hydration.   4 Enterococcal UTI-continue antibiotics.  5 elevated troponin-minimally elevated and no clear trend. Not consistent with acute coronary syndrome. No further ischemia evaluation.  6 history of hypertension-blood pressure is borderline. Continue off of ACE inhibitor to allow room for low-dose metoprolol for atrial fibrillation.     Signed, Kirk Ruths, MD  12/18/2016, 10:08 AM

## 2016-12-18 NOTE — ED Notes (Signed)
Pt eating breakfast 

## 2016-12-18 NOTE — Progress Notes (Signed)
Pharmacy Antibiotic Note Bruce Webster is a 83 y.o. male admitted on 12/17/2016 with UTI. Hx of E.faecalis UTI a few weeks ago.  Pharmacy has been consulted for Unasyn dosing.  Plan: Unasyn 3 grams IV every 12 hours  Will follow peripherally   Height: 6' (182.9 cm) Weight: 103 lb (46.7 kg) IBW/kg (Calculated) : 77.6  Temp (24hrs), Avg:97.7 F (36.5 C), Min:97.6 F (36.4 C), Max:97.8 F (36.6 C)   Recent Labs Lab 12/17/16 2134 12/17/16 2156 12/18/16 0519  WBC 12.6*  --  11.0*  CREATININE 1.22 1.20 1.08    Estimated Creatinine Clearance: 28.8 mL/min (by C-G formula based on SCr of 1.08 mg/dL).    No Known Allergies   Thank you for allowing pharmacy to be a part of this patient's care.  Vincenza Hews, PharmD, BCPS 12/18/2016, 10:15 AM

## 2016-12-18 NOTE — ED Notes (Signed)
O2 sat noted to drop on and off.  Difficult to get a good sat due to poor profusion.  Placed on O2 at 2L for comfort.

## 2016-12-19 ENCOUNTER — Other Ambulatory Visit (HOSPITAL_COMMUNITY): Payer: Medicare Other

## 2016-12-19 DIAGNOSIS — R1310 Dysphagia, unspecified: Secondary | ICD-10-CM

## 2016-12-19 DIAGNOSIS — Z7189 Other specified counseling: Secondary | ICD-10-CM

## 2016-12-19 DIAGNOSIS — Z515 Encounter for palliative care: Secondary | ICD-10-CM

## 2016-12-19 DIAGNOSIS — C799 Secondary malignant neoplasm of unspecified site: Secondary | ICD-10-CM

## 2016-12-19 LAB — GLUCOSE, CAPILLARY: GLUCOSE-CAPILLARY: 79 mg/dL (ref 65–99)

## 2016-12-19 MED ORDER — LOPERAMIDE HCL 2 MG PO CAPS: 2.0000 mg | ORAL_CAPSULE | ORAL | Status: DC | PRN

## 2016-12-19 MED ORDER — SODIUM CHLORIDE 0.9% FLUSH
3.0000 mL | Freq: Two times a day (BID) | INTRAVENOUS | Status: DC
  Administered 2016-12-19 – 2016-12-21 (×3): 3 mL via INTRAVENOUS

## 2016-12-19 MED ORDER — SODIUM CHLORIDE 0.9 % IV SOLN: 250.0000 mL | INTRAVENOUS | Status: DC | PRN

## 2016-12-19 MED ORDER — ALPRAZOLAM 0.25 MG PO TABS
0.2500 mg | ORAL_TABLET | Freq: Three times a day (TID) | ORAL | Status: DC | PRN
  Administered 2016-12-19: 0.25 mg via ORAL
  Filled 2016-12-19 (×2): qty 1

## 2016-12-19 MED ORDER — GUAIFENESIN ER 600 MG PO TB12
600.0000 mg | ORAL_TABLET | Freq: Two times a day (BID) | ORAL | Status: DC
  Administered 2016-12-19: 600 mg via ORAL
  Filled 2016-12-19: qty 1

## 2016-12-19 MED ORDER — MORPHINE SULFATE (CONCENTRATE) 10 MG/0.5ML PO SOLN: 5.0000 mg | ORAL | Status: DC | PRN

## 2016-12-19 MED ORDER — SODIUM CHLORIDE 0.9% FLUSH: 3.0000 mL | INTRAVENOUS | Status: DC | PRN

## 2016-12-19 NOTE — NC FL2 (Signed)
[place] LEVEL OF CARE SCREENING TOOL     IDENTIFICATION  Patient Name: Bruce Webster Birthdate: 15-Apr-1923 Sex: male Admission Date (Current Location): 12/17/2016  [facility] and [place] Number:  Herbalist and Address:  [facility], [place] [address] [place], [place] [address]      Provider Number: [id number]  Attending Physician Name and Address:  Barton Dubois, MD  Relative Name and Phone Number:  [name], friend, [phone]     Current Level of Care: Hospital Recommended Level of Care: [place] Prior Approval Number:    Date Approved/Denied:   PASRR Number: [id number]  Discharge Plan: SNF    Current Diagnoses: Patient Active Problem List   Diagnosis Date Noted  . New onset a-fib ([place]) 12/17/2016  . Atrial fibrillation with RVR ([place]) 12/17/2016  . Near syncope 12/17/2016  . Cough 12/17/2016  . Sacral decubitus ulcer 12/17/2016  . Essential hypertension, benign   . Malnutrition of moderate degree 04/24/2015  . Acute blood loss anemia 04/23/2015  . UTI (urinary tract infection) 04/23/2015  . Acute kidney injury ([place]) 04/23/2015  . Intertrochanteric fracture of right hip ([place]) 04/22/2015  . HTN (hypertension) 04/22/2015  . Right hip pain 04/22/2015  . Hip fracture ([place]) 04/22/2015    Orientation RESPIRATION BLADDER Height & Weight     Self, Place  O2 (nasal cannula 3L/min) Continent Weight: 104 lb (47.2 kg) Height:  6' (182.9 cm)  BEHAVIORAL SYMPTOMS/MOOD NEUROLOGICAL BOWEL NUTRITION STATUS      Incontinent Diet (heart diet, please see DC summary)  AMBULATORY STATUS COMMUNICATION OF NEEDS Skin   Limited Assist Verbally PU Stage and Appropriate Care (stage 2 buttocks and back, foam dressing)                       Personal Care Assistance Level of Assistance  Bathing, Feeding, Dressing Bathing Assistance: Limited assistance Feeding assistance: Independent Dressing Assistance:  Limited assistance     Functional Limitations Info  Sight, Hearing, Speech Sight Info: Adequate Hearing Info: Impaired Speech Info: Adequate    SPECIAL CARE FACTORS FREQUENCY  PT (By licensed PT), OT (By licensed OT)     PT Frequency: 5x/week OT Frequency: 5x/week            Contractures Contractures Info: Not present    Additional Factors Info  Code Status, Allergies Code Status Info: DNR Allergies Info: NKA           Current Medications (12/19/2016):  This is the current hospital active medication list Current Facility-Administered Medications  Medication Dose Route Frequency Provider Last Rate Last Dose  . 0.9 %  sodium chloride infusion   Intravenous Continuous Etta Quill, DO 75 mL/hr at 12/18/16 1553    . Ampicillin-Sulbactam (UNASYN) 3 g in sodium chloride 0.9 % 100 mL IVPB  3 g Intravenous Q12H Barton Dubois, MD   Stopped at 12/19/16 1034  . aspirin tablet 325 mg  325 mg Oral Daily Barton Dubois, MD   325 mg at 12/19/16 1004  . benzonatate (TESSALON) capsule 200 mg  200 mg Oral TID PRN Etta Quill, DO      . collagenase (SANTYL) ointment   Topical Daily Barton Dubois, MD      . feeding supplement (BOOST / RESOURCE BREEZE) liquid 1 Container  1 Container Oral TID BM Barton Dubois, MD   1 Container at 12/19/16 1005  . guaiFENesin (MUCINEX) 12 hr tablet 600 mg  600 mg Oral BID Barton Dubois, MD      . HYDROcodone-acetaminophen (NORCO/VICODIN) 5-325 MG per tablet 1 tablet  1 tablet Oral Q4H PRN Etta Quill, DO      . loratadine (CLARITIN) tablet 10 mg  10 mg Oral Daily Jennette Kettle M, DO   10 mg at 12/19/16 1005  . metoprolol tartrate (LOPRESSOR) tablet 12.5 mg  12.5 mg Oral BID Jennette Kettle M, DO   12.5 mg at 12/19/16 1005  . sodium chloride flush (NS) 0.9 % injection 3 mL  3 mL Intravenous Q12H Jennette Kettle M, DO   3 mL at 12/19/16 1005     Discharge Medications: Please see discharge summary for a list of discharge  medications.  Relevant Imaging Results:  Relevant Lab Results:   Additional Information SSN: [id number]  Estanislado Emms, LCSW

## 2016-12-19 NOTE — Progress Notes (Signed)
   12/19/16 1700  Clinical Encounter Type  Visited With Patient  Visit Type Initial;Spiritual support;Social support  Referral From Palliative care team  Consult/Referral To Chaplain  Spiritual Encounters  Spiritual Needs Prayer;Emotional  Stress Factors  Patient Stress Factors Health changes;Lack of caregivers;Loss of control;Major life changes    Chaplain responded to [place] consult from palliative care team. Patient has friend in room, male. She left while we spoke and returned. Patient is afraid of the "coming days" and is anxious for his friend [name] to return. Talked about God and what the end of his life might look like. Provide prayer and ministry of presence. Left HCPOA consult open for daytime chaplain to follow up on tomorrow. Raseel Jans L. Volanda Napoleon, MDiv

## 2016-12-19 NOTE — Progress Notes (Signed)
[facility] PROGRESS NOTE  Bruce Webster KGM:[id number] DOB: 11-Apr-1923 DOA: 12/17/2016 PCP: Asencion Noble, MD  Interim summary and HPI 83 y/o with hx of bladder cancer, HTN and prior tobacco abuse. Presented with near syncope event. Found to have A. Fib with RVR and also UTI. Given concomitant complaints of cough and weight loss for multiple months now; CT scan chest was done and demonstrated lung mass.  Assessment/Plan: 1-near syncope: feeling weak and tired. New A. Fib -patient presented on A. Fib with RVR -patient rate has remained controlled; but on A. Fib. -continue telemetry for now -CHADsVASC score 3; patient with high falling risk and given underlying cormobidites and age, not a good candidate for anticoagulation  -continue metoprolol and ASA -2-D echo ordered on admission and pending   2-lung mass: seen on CT scan -new finding and appearance/red flags symptoms (weight loss, hoarseness, chronic cough) suggesting lung cancer -after long discussion with patient he will like to treat himself conservatively and no further work up or intervention to be done for this -Palliative care consulted to assist with GOC/advance directives and MUST form -patient is DNR/DNI; EPIC updated -will most likely need SNF with palliative care and main focus of comfort care.  3-acute UTI: with prior cx showing enterococcus faecalis  -initially on rocephin; which was discontinued -will continue unasyn -continue supportive care and adequate hydration   4-severe protein calorie malnutrition -will use boost TID between meals -Body mass index is 14.1 kg/m.  5-HTN -Blood pressure stable overall  -Will continue treatment with metoprolol  -In order to have room for beta blocker therapy, lisinopril and Norvasc has been discontinued.  6-pressure injury and inner buttocks wounds -Will follow recommendations from [place] -continue preventative measures   7-elevated troponin -Most likely demand  ischemia from atrial fibrillation  -Patient has remained chest pain-free  -Echo has been ordered and pending at this moment; will follow results -Continue aspirin and beta blocker.  -No further cardiac evaluation anticipated despite echo results as communicated by cardiology service.   Code Status: DNR/DNI Family Communication: no family at bedside  Disposition Plan: to be determine. But patient was living alone by himself; no family and in very unkept situation. Also with new findings concerning for lung cancer and new diagnosis of A. Fib. Will most likely need a skilled nursing facility with palliative care follow-up; ideally with the main focus for comfort..    Consultants:  Cardiology   Palliative care  Procedures:  See below for x-ray reports  2-D echo: pending   Antibiotics:  Unasyn 6/18  HPI/Subjective: Afebrile, no chest pain and denying shortness of breath. Per nursing staff report patient with some difficulty swallowing regular diet and with increase gargling. Patient is hoarse on exam.  Objective: Vitals:   12/19/16 0647 12/19/16 0821  BP: (!) 143/87 119/69  Pulse: (!) 59   Resp: 17   Temp: 97.6 F (36.4 C) 98.1 F (36.7 C)    Intake/Output Summary (Last 24 hours) at 12/19/16 1442 Last data filed at 12/19/16 0400  Gross per 24 hour  Intake          2674.35 ml  Output              350 ml  Net          2324.35 ml   Filed Weights   12/18/16 0912 12/19/16 0647  Weight: 46.7 kg (103 lb) 47.2 kg (104 lb)    Exam:   General: Afebrile, no chest pain, reports that he  is breathing okay.  Patient is cachectic, unkept, frail, chronically ill in appearance and in no acute distress. Patient is very hard of hearing. Denies dysuria.  Cardiovascular: Positive systolic murmur, no rubs, no gallops, rate controlled. After reviewing telemetry, found that the patient continued to be on atrial fibrillation.  Respiratory: diffuse rhonchi, no wheezing, fair air movement,  no crackles.   Abdomen:  soft, nontender, nondistended, positive bowel sounds.   Musculoskeletal: no cyanosis, no clubbing  Skin: patient's mid back and lower back stage 1-2 pressure ulcers now cover and without signs of acute drainage. Inner buttocks wound w/o surrounding erythema.  Data Reviewed: Basic Metabolic Panel:  Recent Labs Lab 12/17/16 2134 12/17/16 2156 12/18/16 0519  NA 138 138 137  K 4.7 4.6 4.4  CL 103 101 104  CO2 26  --  25  GLUCOSE 115* 114* 86  BUN 35* 36* 33*  CREATININE 1.22 1.20 1.08  CALCIUM 9.2  --  8.4*  MG 2.2  --   --    Liver Function Tests:  Recent Labs Lab 12/17/16 2134  AST 18  ALT 17  ALKPHOS 102  BILITOT 0.5  PROT 6.4*  ALBUMIN 3.2*   CBC:  Recent Labs Lab 12/17/16 2134 12/17/16 2156 12/18/16 0519  WBC 12.6*  --  11.0*  NEUTROABS 10.6*  --   --   HGB 13.2 13.6 11.7*  HCT 41.2 40.0 37.7*  MCV 98.8  --  100.3*  PLT 225  --  204   Cardiac Enzymes:  Recent Labs Lab 12/17/16 2134 12/17/16 2359 12/18/16 0519 12/18/16 1157  TROPONINI 0.07* 0.13* 0.07* 0.06*   CBG:  Recent Labs Lab 12/18/16 0633 12/18/16 0655 12/18/16 0734 12/19/16 0744  GLUCAP 57* 61* 89 79    Recent Results (from the past 240 hour(s))  MRSA PCR Screening     Status: None   Collection Time: 12/18/16 10:35 AM  Result Value Ref Range Status   MRSA by PCR NEGATIVE NEGATIVE Final    Comment:        The GeneXpert MRSA Assay (FDA approved for NASAL specimens only), is one component of a comprehensive MRSA colonization surveillance program. It is not intended to diagnose MRSA infection nor to guide or monitor treatment for MRSA infections.      Studies: Ct Chest W Contrast  Result Date: 12/18/2016 CLINICAL DATA:  Cough.  History of hypertension and cancer. EXAM: CT CHEST WITH CONTRAST TECHNIQUE: Multidetector CT imaging of the chest was performed during intravenous contrast administration. CONTRAST:  72mL ISOVUE-300 IOPAMIDOL (ISOVUE-300)  INJECTION 61% COMPARISON:  Chest radiograph 12/17/2016 FINDINGS: Cardiovascular: There is extensive atherosclerotic plaque throughout the thoracic aorta. The heart size is normal. There are coronary artery calcifications. Mediastinum/Nodes: There is a large left hilar/ mediastinal mass encasing the distal trachea and left mainstem bronchus and extending along the left lower lobe bronchus. The mass measures approximately 8 x 6 cm. This mass also abuts 180 degrees of both left pulmonary veins. The mass encases the left pulmonary artery and narrows the proximal left upper and lower lobar arteries. There is another upper mediastinal mass that abuts the superior vena cava, markedly narrowing at, and also abuts the brachiocephalic artery. This mass measures 3.2 x 2.6 cm. Lungs/Pleura: Severe emphysema. 7 mm right middle lobe nodule, 4 mm right upper lobe nodule, 6 mm right lower lobe perifissural nodule. A pleural-based left lower lobe nodule measures 5 mm. No pleural effusion. There is debris layering within the lower trachea. Upper Abdomen: Severe bilateral  renal cortical thinning. Right upper pole renal cyst measures 3 cm. The liver is heterogeneous with a nodular contour and multiple areas of hypoattenuation. Musculoskeletal: Multilevel thoracic osteophytosis. No focal lytic lesions are identified. There is an old fracture of the right eleventh rib. IMPRESSION: 1. Large mediastinal and left hilar mass encasing the lower trachea, left mainstem bronchus and left lower lobe bronchus. The mass encases the left main pulmonary artery and left upper and lower lobar arteries. The mass also abuts the right main pulmonary artery, right upper lobar artery and both left pulmonary veins. 2. Right upper mediastinal mass compressing the superior vena cava and abutting the right brachiocephalic artery. 3. Multiple right-sided pulmonary nodules, concerning for neoplastic/metastatic lesions. At a minimum, follow-up chest CT in 3 months  is recommended. 4. Heterogeneous appearance of the liver with multiple areas of hypoattenuation. Hepatic metastatic disease would be difficult to exclude. This could be further evaluated with MRI, if clinically indicated. 5. Aortic Atherosclerosis (ICD10-I70.0) and Emphysema (ICD10-J43.9). Electronically Signed   By: Ulyses Jarred M.D.   On: 12/18/2016 00:07   Dg Chest Port 1 View  Result Date: 12/17/2016 CLINICAL DATA:  Cough and dizziness. Productive cough with clear sputum. EXAM: PORTABLE CHEST 1 VIEW COMPARISON:  11/27/2016 FINDINGS: Stable hyperinflation. Normal heart size with stable tortuosity and atherosclerosis of the thoracic aorta. No consolidation, pulmonary edema, pleural effusion or pneumothorax. Stable osseous structures. IMPRESSION: 1. No acute abnormality. 2. Stable hyperinflation suggesting COPD. 3. Atherosclerosis of the thoracic aorta. Electronically Signed   By: Jeb Levering M.D.   On: 12/17/2016 22:00    Scheduled Meds: . aspirin  325 mg Oral Daily  . collagenase   Topical Daily  . feeding supplement  1 Container Oral TID BM  . guaiFENesin  600 mg Oral BID  . loratadine  10 mg Oral Daily  . metoprolol tartrate  12.5 mg Oral BID  . sodium chloride flush  3 mL Intravenous Q12H   Continuous Infusions: . sodium chloride 75 mL/hr at 12/18/16 1553  . ampicillin-sulbactam (UNASYN) IV Stopped (12/19/16 1034)    Time spent: 25 minutes    Barton Dubois  [facility] Pager [phone]. If 7PM-7AM, please contact night-coverage at [email or web address], password [facility] 12/19/2016, 2:42 PM  LOS: 1 day
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
## 2016-12-19 NOTE — Evaluation (Signed)
Clinical/Bedside Swallow Evaluation Patient Details  Name: Bruce Webster MRN: [id number] Date of Birth: [birth year]  Today's Date: 12/19/2016 Time: SLP Start Time (ACUTE ONLY): 1512 SLP Stop Time (ACUTE ONLY): 1545 SLP Time Calculation (min) (ACUTE ONLY): 33 min  Past Medical History:  Past Medical History:  Diagnosis Date  . Acute blood loss anemia 04/23/2015  . Acute kidney injury ([place]) 04/23/2015  . Cancer [facility])    Bladder  . Hypertension    Past Surgical History:  Past Surgical History:  Procedure Laterality Date  . CHOLECYSTECTOMY    . EYE SURGERY     Cateract  . INTRAMEDULLARY (IM) NAIL INTERTROCHANTERIC Right 04/22/2015   Procedure: INTRAMEDULLARY (IM) NAIL RIGHT HIP;  Surgeon: Meredith Pel, MD;  Location: [place];  Service: Orthopedics;  Laterality: Right;  . KIDNEY STONE SURGERY    . TONSILLECTOMY     HPI:  Bruce Webster a 83 y.o.malewith medical history significant of Bladder Cancer, HTN. Patient lives alone but has people come and check on him. Reportedly was sitting at home and had near syncopal episode. EMS called and initial SBP was in the 80s, improved to 90s with small amount of IVF.Patient has had weight loss over the past couple of months, has also had persistent wet sounding cough over the past couple of months. CT of chest with lung masses and nodules.    Assessment / Plan / Recommendation Clinical Impression   Pt presents with a suspected severe pharyngeal dysphagia with poor respiratory status as masses and nodules reported on CT of chest.  Baseline vocal quality minimally intelligible with wet vocal quality and poor phonation as laryngeal secretions suspected. Cues for volitional cough aided in some management of secretions that were expelled orally, however cough strength remains weak and secretions noted to be pink tinged. Pt on supplemental oxygen with baseline O2 in the mid 80s to upper 80s. Notified respiratory therapy and nursing.  Observed thin liquids by cup and puree trials both resulting in immediate and delayed coughing concerning for airway compromise. Pt reports "getting choked with occasional throwing up" during eating. Consulted with MD regarding swallow function and significant concern for aspiration however MD reports pt does not want medical work up and wishes for comfort measures. PO diet option is not recommended however note palliative course of action. Defer diet recommendations to MD. No further ST needs identified.    SLP Visit Diagnosis: Dysphagia, pharyngeal phase (R13.13)    Aspiration Risk  Severe aspiration risk;Risk for inadequate nutrition/hydration    Diet Recommendation   Defer to MD and palliative practitioner  Medication Administration: Other (Comment)    Other  Recommendations Oral Care Recommendations: Oral care BID   Follow up Recommendations Other (comment) (TBD based upon palliative goals of care)      Frequency and Duration   No tx recommended          Prognosis Prognosis for Safe Diet Advancement: Guarded Barriers to Reach Goals: Severity of deficits      Swallow Study   General Date of Onset: 12/17/16 HPI: Bruce Webster a 83 y.o.malewith medical history significant of Bladder Cancer, HTN. Patient lives alone but has people come and check on him. Reportedly was sitting at home and had near syncopal episode. EMS called and initial SBP was in the 80s, improved to 90s with small amount of IVF.Patient has had weight loss over the past couple of months, has also had persistent wet sounding cough over the past couple of months. CT  of chest with lung masses and nodules.  Type of Study: Bedside Swallow Evaluation Previous Swallow Assessment: none on file Diet Prior to this Study: Regular;Thin liquids Temperature Spikes Noted: No Respiratory Status: Nasal cannula History of Recent Intubation: No Behavior/Cognition: Alert;Cooperative Oral Cavity Assessment: Dry Oral  Cavity - Dentition: Missing dentition;Poor condition Patient Positioning: Upright in chair Baseline Vocal Quality: Low vocal intensity;Other (comment);Wet (congested, wet, poor phonation minimally intelligible) Volitional Cough: Weak;Congested    Oral/Motor/Sensory Function Overall Oral Motor/Sensory Function: Generalized oral weakness   Ice Chips     Thin Liquid Thin Liquid: Impaired Presentation: Cup Oral Phase Functional Implications: Prolonged oral transit Pharyngeal  Phase Impairments: Suspected delayed Swallow;Multiple swallows;Cough - Immediate;Cough - Delayed;Wet Vocal Quality;Change in Vital Signs    Nectar Thick Nectar Thick Liquid: Not tested   Honey Thick Honey Thick Liquid: Not tested   Puree Puree: Impaired Presentation: Spoon Oral Phase Functional Implications: Prolonged oral transit Pharyngeal Phase Impairments: Suspected delayed Swallow;Unable to trigger swallow;Wet Vocal Quality;Multiple swallows;Cough - Immediate;Cough - Delayed;Throat Clearing - Delayed;Throat Clearing - Immediate;Change in Vital Signs   Solid   GO   Solid: Not tested       Bruce Chaco MA, CCC-SLP Acute Care Speech Language Pathologist    Levi Aland 12/19/2016,3:45 PM

## 2016-12-19 NOTE — Clinical Social Work Note (Signed)
Clinical Social Work Assessment  Patient Details  Name: Bruce Webster MRN: [id number] Date of Birth: 07-May-1923  Date of referral:  12/19/16               Reason for consult:  Facility Placement                Permission sought to share information with:  Facility Sport and exercise psychologist, Family Supports Permission granted to share information::  Yes, Verbal Permission Granted  Name::     Bruce Webster  Agency::  [facility]  Relationship::  Friend     Contact Information:  [phone]   Housing/Transportation Living arrangements for the past 2 months:  False Pass of Information:  Patient, Friend/Neighbor Patient Interpreter Needed:  None Criminal Activity/Legal Involvement Pertinent to Current Situation/Hospitalization:  No - Comment as needed Significant Relationships:  Friend Lives with:  Self Do you feel safe going back to the place where you live?  Yes Need for family participation in patient care:  Yes (Comment)  Care giving concerns: Patient is from home alone. Patient has a close friend, Bruce Webster, who visits him daily and was at bedside. Patient's close family friends, Bruce Webster and Bruce [place], live in [place] and plan to visit patient in [place] soon. Friends are main source of support for patient.  Social Worker assessment / plan: CSW met with patient and friend, Bruce Webster, at bedside. Friend assisted in providing information abotu patient history. Patient agreeable to SNF for rehab. CSW spoke to patient's friend Bruce [place] via phone, who indicated she and husband consider patient as a father. Bruce Webster also agreeable to SNF. Patient and friends prefer facility in [place]. CSW will send referrals and continue to follow for discharge planning.  Employment status:  Retired Forensic scientist:  Medicare PT Recommendations:  [place] / Referral to community resources:  [place]  Patient/Family's Response to care: Patient hard of hearing and  friend at bedside helped to provide information. Patient agreeable to SNF and with appropriate, though somewhat flat affect.  Patient/Family's Understanding of and Emotional Response to Diagnosis, Current Treatment, and Prognosis: Patient hard of hearing and has very soft voice, though was able to communicate understanding of discharge process and agreeability to SNF. Patient's friends with concern for patient and good understanding of conditions. Friends agreeable to SNF and appreciative of patient's care.  Emotional Assessment Appearance:  Appears stated age Attitude/Demeanor/Rapport:  Other (appropriate) Affect (typically observed):  Appropriate Orientation:  Oriented to Self, Oriented to Place Alcohol / Substance use:  Not Applicable Psych involvement (Current and /or in the community):  No (Comment)  Discharge Needs  Concerns to be addressed:  Discharge Planning Concerns Readmission within the last 30 days:  No Current discharge risk:  Dependent with Mobility, Lives alone Barriers to Discharge:  Continued Medical Work up   Bruce Emms, LCSW 12/19/2016, 3:15 PM

## 2016-12-19 NOTE — Evaluation (Signed)
Occupational Therapy Evaluation Patient Details Name: Bruce Webster MRN: [id number] DOB: [birth year] Today's Date: 12/19/2016    History of Present Illness Pt admitted with near syncope, weight loss and cough. Pt found to have afib with RVR, UTI and lung mass. PMH: bladder cancer, HTN.    Clinical Impression   Pt reports being independent in ADL and IADL prior to admission. He lives alone. Limited evaluation today due to pt becoming agitated with orientation questions (pt only oriented to self). Pt presents with generalized weakness and poor activity tolerance. He reports multiple falls at home. Will follow acutely, recommending SNF upon discharge.    Follow Up Recommendations  SNF    Equipment Recommendations       Recommendations for Other Services       Precautions / Restrictions Precautions Precautions: Fall Precaution Comments: pt reports 4 recent falls Restrictions Weight Bearing Restrictions: No      Mobility Bed Mobility Overal bed mobility: Needs Assistance Bed Mobility: Supine to Sit;Sit to Supine     Supine to sit: Min assist Sit to supine: Supervision   General bed mobility comments: min assist to raise trunk  Transfers                 General transfer comment: pt refused OOB    Balance Overall balance assessment: Needs assistance   Sitting balance-Leahy Scale: Fair                                     ADL either performed or assessed with clinical judgement   ADL Overall ADL's : Needs assistance/impaired Eating/Feeding: Set up;Sitting   Grooming: Supervision/safety;Sitting   Upper Body Bathing: Supervision/ safety;Sitting   Lower Body Bathing: Minimal assistance   Upper Body Dressing : Minimal assistance;Sitting   Lower Body Dressing: Minimal assistance                 General ADL Comments: Pt initially answering questions regarding home set up. When asking orientation questions, pt became agitated and asked  therapist to leave him alone     Vision Baseline Vision/History: Wears glasses Wears Glasses: At all times Patient Visual Report: No change from baseline       Perception     Praxis      Pertinent Vitals/Pain Pain Assessment: Faces Faces Pain Scale: No hurt     Hand Dominance Right   Extremity/Trunk Assessment Upper Extremity Assessment Upper Extremity Assessment: Generalized weakness   Lower Extremity Assessment Lower Extremity Assessment: Defer to PT evaluation   Cervical / Trunk Assessment Cervical / Trunk Assessment: Kyphotic   Communication Communication Communication: Expressive difficulties (very low volume voice)   Cognition Arousal/Alertness: Awake/alert Behavior During Therapy: Agitated Overall Cognitive Status: Impaired/Different from baseline Area of Impairment: Orientation;Memory                 Orientation Level: Disoriented to;Place;Time;Situation   Memory: Decreased short-term memory             General Comments       Exercises     Shoulder Instructions      Home Living Family/patient expects to be discharged to:: Private residence Living Arrangements: Alone   Type of Home: House Home Access: Stairs to enter Technical brewer of Steps: 10 Entrance Stairs-Rails: None Home Layout: One level     Bathroom Shower/Tub:  (pt reports sponge bathing)   Bathroom Toilet: Standard  Home Equipment: [place] - 4 wheels;Shower seat;Toilet riser          Prior Functioning/Environment Level of Independence: Independent        Comments: reports driving        OT Problem List: Decreased strength;Decreased activity tolerance;Impaired balance (sitting and/or standing);Decreased cognition;Decreased safety awareness;Decreased knowledge of use of DME or AE;Cardiopulmonary status limiting activity      OT Treatment/Interventions: Self-care/ADL training;Patient/family education;Balance training;Cognitive  remediation/compensation;DME and/or AE instruction;Energy conservation    OT Goals(Current goals can be found in the care plan section) Acute Rehab OT Goals Patient Stated Goal: to return home OT Goal Formulation: Patient unable to participate in goal setting Time For Goal Achievement: 01/02/17 Potential to Achieve Goals: Good ADL Goals Pt Will Perform Grooming: with supervision;standing Pt Will Perform Upper Body Dressing: with supervision;sitting Pt Will Perform Lower Body Dressing: with supervision;sit to/from stand Pt Will Transfer to Toilet: ambulating;with supervision;bedside commode (over toilet) Pt Will Perform Toileting - Clothing Manipulation and hygiene: with supervision;sit to/from stand Additional ADL Goal #1: Pt will use environmental cues to recall place and time.  OT Frequency: Min 2X/week   Barriers to D/C: Decreased caregiver support          Co-evaluation              AM-PAC PT "6 Clicks" Daily Activity     Outcome Measure Help from another person eating meals?: None Help from another person taking care of personal grooming?: A Little Help from another person toileting, which includes using toliet, bedpan, or urinal?: A Little Help from another person bathing (including washing, rinsing, drying)?: A Little Help from another person to put on and taking off regular upper body clothing?: A Little Help from another person to put on and taking off regular lower body clothing?: A Little 6 Click Score: 19   End of Session Equipment Utilized During Treatment: Oxygen  Activity Tolerance: Treatment limited secondary to agitation Patient left: in bed;with call bell/phone within reach;with nursing/sitter in room;with bed alarm set  OT Visit Diagnosis: Unsteadiness on feet (R26.81);Muscle weakness (generalized) (M62.81);History of falling (Z91.81);Other symptoms and signs involving cognitive function                Time: 6861-6837 OT Time Calculation (min): 15  min Charges:  OT General Charges $OT Visit: 1 Procedure OT Evaluation $OT Eval Moderate Complexity: 1 Procedure G-Codes:     Malka So 12/19/2016, 10:26 AM  [phone]

## 2016-12-19 NOTE — Consult Note (Signed)
Consultation Note Date: 12/19/2016   Patient Name: Bruce Webster  DOB: 1923-05-15  MRN: [id number]  Age / Sex: 83 y.o., male  PCP: Bruce Noble, MD Referring Physician: Barton Dubois, MD  Reason for Consultation: Establishing goals of care  HPI/Patient Profile: 83 y.o. male  with past medical history of bladder cancer, malnutrition, Colon polyps s/p multiple colon surgeries, right hip fracture in 2016, who was admitted on 12/17/2016 with hypotension and near syncope.  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  Bruce Webster weighs 104 lbs.  He has been living alone in a home owned by his close friend and health care surrogate Bruce Webster.  Clinical Assessment and Goals of Care:  I have reviewed medical records including EPIC notes, labs and imaging, received report from Dr. Dyann Webster, assessed the patient and then called Bruce Webster on speaker phone with the patient to discuss diagnosis prognosis, Bruce Webster, EOL wishes, disposition and options.   I introduced Palliative Medicine as specialized medical care for people living with serious illness. It focuses on providing relief from the symptoms and stress of a serious illness. The goal is to improve quality of life for both the patient and the family.  We discussed a brief life review of the patient.  Bruce Webster never married.  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  The love of his life was Bruce Webster Melvin's mother.  Bruce Webster and Bruce Webster think of each other as father and son.  Bruce Webster is Bruce Webster health care surrogate decision maker.  Bruce Webster pays has bills and helps him as much as possible.  Unfortunately Bruce Webster lives in [place].  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  He retired in the 1980s and has lived alone ever since.  He has no living family.   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    We discussed the results of the testing completed during this hospitalization.  Both Bruce Webster and Bruce Webster realize that Bruce Webster time is short - weeks to months.  Both elect complete comfort care.  We completed a MOST form with the following choices: DNR Comfort Measures Only Determine antibiotic use at the time of infection NO IVF NO PEG or artificial feeding.  Bruce Webster is still walking and eating on his own.  His mind is clear.  He simply wants to be comfortable and well cared for.  Bruce Webster indicated to me that money was not a concern - rather Bruce Webster comfort is of the utmost importance.  We discussed symptom management.  Bruce Webster complained of bowel incontinence.  He normally takes imodium for this.  He complained of insomnia - but states he never sleeps well.  Finally he complained of anxiety over his recent diagnosis and his unknown future.  Hospice and Palliative Care services outpatient were explained and offered.  Questions and concerns were addressed. PMT will continue to support holistically.   Primary Decision  Maker:  PATIENT    SUMMARY OF RECOMMENDATIONS    Total comfort care.  Discontinue all interventions not related to comfort.  a MOST form with the following choices: DNR Comfort Measures Only Determine antibiotic use at the time of infection NO IVF NO PEG or artificial feeding.  Highly recommend D/C to Assisted Living in [place] with Hospice Services. I will discuss this with Bruce Webster and Bruce Webster in the morning on 6/20.  Code Status/Advance Care Planning:  DNR   Symptom Management:   Bowel incontinence - imodium PRN  Anxiety - Xanax PRN  Wound Care  Liquid morphine concentrate for dyspnea and pain.  Regular soft diet (dysphagia 3 - comfort feedings) with aspiration precautions.   Psycho-social/Spiritual:   Desire for further Chaplaincy support:  Prognosis:   Weeks to Months.  Likely less  than 3 months given widely metastatic cancer encasing critical vessels and trachea.  Severe aspiration, Afib with RVR, severe malnutrition, frailty.  Discharge Planning: To Be Determined  Recommend ALF (Bruce [place]) with Hospice Services.      Primary Diagnoses: Present on Admission: . HTN (hypertension) . New onset a-fib (Bruce [place]) . Atrial fibrillation with RVR (Bruce [place]) . Near syncope . Cough . Sacral decubitus ulcer   I have reviewed the medical record, interviewed the patient and family, and examined the patient. The following aspects are pertinent.  Past Medical History:  Diagnosis Date  . Acute blood loss anemia 04/23/2015  . Acute kidney injury (Bruce [place]) 04/23/2015  . Cancer [facility])    Bladder  . Hypertension    Social History   Social History  . Marital status: Single    Spouse name: N/A  . Number of children: N/A  . Years of education: N/A   Social History Main Topics  . Smoking status: Former Smoker    Packs/day: 0.25    Types: Cigarettes, Cigars    Quit date: 02/01/2015  . Smokeless Webster: Never Used  . Alcohol use No  . Drug use: No  . Sexual activity: Not Currently   Other Topics Concern  . None   Social History Narrative  . None   No family history on file. Scheduled Meds: . aspirin  325 mg Oral Daily  . collagenase   Topical Daily  . feeding supplement  1 Container Oral TID BM  . guaiFENesin  600 mg Oral BID  . loratadine  10 mg Oral Daily  . metoprolol tartrate  12.5 mg Oral BID  . sodium chloride flush  3 mL Intravenous Q12H   Continuous Infusions: . sodium chloride 75 mL/hr at 12/18/16 1553  . ampicillin-sulbactam (UNASYN) IV Stopped (12/19/16 1034)   PRN Meds:.ALPRAZolam, benzonatate, loperamide, morphine CONCENTRATE No Known Allergies Review of Systems complains of insomnia, bowel incontinence, dysphagia, cough, fatigue, weight loss, anxiety  Physical Exam  Frail cachectically thin 83 yo gentleman, HOH, A&O cooperative, pleasant, poor  dentition CV slightly brady with 2/6 murmur Abdomen thin, firm, nt  Vital Signs: BP 100/65 (BP Location: Right Arm)   Pulse (!) 59   Temp 98.2 F (36.8 C) (Oral)   Resp 16   Ht 6' (1.829 m)   Wt 47.2 kg (104 lb)   SpO2 90%   BMI 14.10 kg/m  Pain Assessment: No/denies pain   Pain Score: 0-No pain   SpO2: SpO2: 90 % O2 Device:SpO2: 90 % O2 Flow Rate: .O2 Flow Rate (L/min): 3 L/min  IO: Intake/output summary:  Intake/Output Summary (Last 24 hours) at 12/19/16 1645 Last data filed at  12/19/16 1212  Gross per 24 hour  Intake          2674.35 ml  Output              650 ml  Net          2024.35 ml    LBM: Last BM Date: 12/18/16 Baseline Weight: Weight: 46.7 kg (103 lb) Most recent weight: Weight: 47.2 kg (104 lb)     Palliative Assessment/Data:   Flowsheet Rows     Most Recent Value  Intake Tab  Referral Department  Hospitalist  Unit at Time of Referral  Cardiac/Telemetry Unit  Palliative Care Primary Diagnosis  Cancer  Date Notified  12/18/16  Palliative Care Type  New Palliative care  Reason for referral  Clarify Goals of Care  Date of Admission  12/17/16  Date first seen by Palliative Care  12/19/16  # of days Palliative referral response time  1 Day(s)  # of days IP prior to Palliative referral  1  Clinical Assessment  Palliative Performance Scale Score  50%  Anxiety Max Last 24 Hours  8  Anxiety Min Last 24 Hours  3  Psychosocial & Spiritual Assessment  Palliative Care Outcomes  Patient/Family meeting held?  Yes  Who was at the meeting?  patient and closest friend Bruce Webster - by phone)  Palliative Care Outcomes  Clarified goals of care  Patient/Family wishes: Interventions discontinued/not started   PEG, Tube feedings/TPN      Time In: 3:30 pm Time Out: 5:00 pm Time Total: 90 min. Greater than 50%  of this time was spent counseling and coordinating care related to the above assessment and plan.  Signed by: Imogene Burn, PA-C Palliative  Medicine Pager: [phone]  Please contact Palliative Medicine Team phone at [phone] for questions and concerns.  For individual provider: See Shea Evans

## 2016-12-20 ENCOUNTER — Observation Stay (HOSPITAL_COMMUNITY): Payer: Medicare Other

## 2016-12-20 DIAGNOSIS — I360 Nonrheumatic tricuspid (valve) stenosis: Secondary | ICD-10-CM

## 2016-12-20 DIAGNOSIS — J069 Acute upper respiratory infection, unspecified: Secondary | ICD-10-CM

## 2016-12-20 DIAGNOSIS — I10 Essential (primary) hypertension: Secondary | ICD-10-CM

## 2016-12-20 LAB — ECHOCARDIOGRAM COMPLETE
HEIGHTINCHES: 72 in
Weight: 1664 oz

## 2016-12-20 MED ORDER — HALOPERIDOL LACTATE 2 MG/ML PO CONC
1.0000 mg | Freq: Three times a day (TID) | ORAL | Status: DC | PRN
  Filled 2016-12-20: qty 0.5

## 2016-12-20 NOTE — Care Management Note (Addendum)
Case Management Note  Patient Details  Name: Bruce Webster MRN: [id number] Date of Birth: 1923/05/06  Subjective/Objective: Pt presented for Syncope and found to be in A Fib with RVR  And positive UTI. Pt initiated on IV Antibiotic therapy. Plan for [facility] with Palliative Care. CSW is assisting with disposition needs. Palliative Care following for Goals of Care.                Action/Plan: Referral sent to CSW in regards to Monetary gift to a non-family member.  CM will continue to monitor.   Expected Discharge Date:                  Expected Discharge Plan:  Skilled Nursing Facility  In-House Referral:  Clinical Social Work  Discharge planning Services  CM Consult  Post Acute Care Choice:  NA Choice offered to:  NA  DME Arranged:  N/A DME Agency:  NA  HH Arranged:  NA HH Agency:  NA  Status of Service:  Completed, signed off  If discussed at [place] of Stay Meetings, dates discussed:    Additional Comments:  Bethena Roys, RN 12/20/2016, 3:34 PM

## 2016-12-20 NOTE — Progress Notes (Signed)
Nutrition Brief Note  Chart reviewed due to MST score of 2. Palliative Care Team following. Current plans are for complete comfort care.  No nutrition interventions warranted at this time.  Please consult as needed.   Molli Barrows, RD, LDN, [place] Pager [phone] After Hours Pager [phone]

## 2016-12-20 NOTE — Progress Notes (Signed)
Progress Note  Patient Name: Bruce Webster Date of Encounter: 12/20/2016  Primary Cardiologist: new - Dr. Stanford Breed  Subjective   Patient states he is short of breath. He is on supplemental O2 and is 98%. He continues to have a cough.  Inpatient Medications    Scheduled Meds: . aspirin  325 mg Oral Daily  . collagenase   Topical Daily  . feeding supplement  1 Container Oral TID BM  . loratadine  10 mg Oral Daily  . metoprolol tartrate  12.5 mg Oral BID  . sodium chloride flush  3 mL Intravenous Q12H  . sodium chloride flush  3 mL Intravenous Q12H   Continuous Infusions: . sodium chloride    . ampicillin-sulbactam (UNASYN) IV Stopped (12/19/16 2330)   PRN Meds: sodium chloride, ALPRAZolam, benzonatate, loperamide, morphine CONCENTRATE, sodium chloride flush   Vital Signs    Vitals:   12/19/16 1628 12/19/16 2025 12/19/16 2200 12/19/16 2351  BP: 100/65 (!) 107/52  (!) 121/56  Pulse: (!) 59 63 (!) 55 (!) 55  Resp: 16 16  16   Temp: 98.2 F (36.8 C) 98.5 F (36.9 C)    TempSrc: Oral Oral    SpO2: 90% 95%  100%  Weight:      Height:        Intake/Output Summary (Last 24 hours) at 12/20/16 0829 Last data filed at 12/20/16 0100  Gross per 24 hour  Intake              640 ml  Output              600 ml  Net               40 ml   Filed Weights   12/18/16 0912 12/19/16 0647  Weight: 103 lb (46.7 kg) 104 lb (47.2 kg)     Physical Exam   General: Well developed, well nourished, male appearing in no acute distress. Head: Normocephalic, atraumatic.  Neck: Supple without bruits, no JVD. Lungs:  Resp regular, coarse sounds throughout with productive cough Heart: RRR, exam difficult 2/ breath sounds Abdomen: Soft, non-tender, non-distended with normoactive bowel sounds. No hepatomegaly. No rebound/guarding. No obvious abdominal masses. Extremities: No clubbing, cyanosis, no edema. Distal pedal pulses are 1+ bilaterally. Neuro: Alert and oriented X 3. Moves all  extremities spontaneously. Psych: Normal affect.  Labs    Chemistry Recent Labs Lab 12/17/16 2134 12/17/16 2156 12/18/16 0519  NA 138 138 137  K 4.7 4.6 4.4  CL 103 101 104  CO2 26  --  25  GLUCOSE 115* 114* 86  BUN 35* 36* 33*  CREATININE 1.22 1.20 1.08  CALCIUM 9.2  --  8.4*  PROT 6.4*  --   --   ALBUMIN 3.2*  --   --   AST 18  --   --   ALT 17  --   --   ALKPHOS 102  --   --   BILITOT 0.5  --   --   GFRNONAA 50*  --  57*  GFRAA 57*  --  >60  ANIONGAP 9  --  8     Hematology Recent Labs Lab 12/17/16 2134 12/17/16 2156 12/18/16 0519  WBC 12.6*  --  11.0*  RBC 4.17*  --  3.76*  HGB 13.2 13.6 11.7*  HCT 41.2 40.0 37.7*  MCV 98.8  --  100.3*  MCH 31.7  --  31.1  MCHC 32.0  --  31.0  RDW 14.6  --  15.1  PLT 225  --  204    Cardiac Enzymes Recent Labs Lab 12/17/16 2134 12/17/16 2359 12/18/16 0519 12/18/16 1157  TROPONINI 0.07* 0.13* 0.07* 0.06*   No results for input(s): TROPIPOC in the last 168 hours.   BNPNo results for input(s): BNP, PROBNP in the last 168 hours.   DDimer No results for input(s): DDIMER in the last 168 hours.   Radiology    No results found.   Telemetry    NSR - Personally Reviewed  ECG    No new tracings - Personally Reviewed   Cardiac Studies   Echocardiogram pending  Patient Profile     83 y.o. male admitted with weakness and dizziness and noted to be in atrial fibrillation. Additional issues include weight loss. Chest CT shows large mediastinal and left hilar mass and right upper mediastinal mass. Multiple right-sided pulmonary nodules.  Assessment & Plan    1. Paroxysmal atrial fibrillation - Patient remains in sinus rhythm - This patients CHA2DS2-VASc Score and unadjusted Ischemic Stroke Rate (% per year) is equal to 3.2 % stroke rate/year from a score of 3 (age, HTN) - Given his age and fall risk he is not a candidate for anticoagulation long-term - continue Lopressor 12.5 mg twice a day - Echocardiogram  ordered today  2. Lung mass - per primary service  3. Weakness/dizziness - improved  4. UTI - continue unasyn  5. Elevated troponin - 0.07 --> 0.13 --> 0.08 --> 0.06 - not consistent with ACS, no further ischemic evaluation  6, HTN - BP improving, 100-120s    Signed, Ledora Bottcher , PA-C 8:29 AM 12/20/2016 Pager: [phone] As above, patient seen and examined. He complains of mild dyspnea but no chest pain or palpitations. He remains in sinus rhythm. We will continue with present dose of metoprolol. He is not a candidate for anticoagulation. He is being evaluated by palliative care for lung mass and other medical issues. We will sign off. Please call with questions. Kirk Ruths, MD

## 2016-12-20 NOTE — Progress Notes (Signed)
   Per patient, patient has a completed HCPOA at home.  Chaplain suggested a friend/loved one bring a copy to [facility] for charting.  Advanced Directive documentation left at bedside.  Will follow, as needed.

## 2016-12-20 NOTE — Progress Notes (Signed)
PROGRESS NOTE    Bruce Webster  OJJ:[id number] DOB: March 05, 1923 DOA: 12/17/2016 PCP: Asencion Noble, MD    Brief Narrative:  83 year old  Admitted for a near syncope episode. Was found to have a fib with RVR and UTI, and CT chest showed evidence of lung mass. He was started on IV antibiotics. Meanwhile a palliative care consult put it and plan to transition to comfort care.  We will complete the course of antibiotic and probably change to oral on discharge.     Assessment & Plan:   Principal Problem:   Near syncope Active Problems:   HTN (hypertension)   New onset a-fib (HCC)   Atrial fibrillation with RVR (HCC)   Cough   Sacral decubitus ulcer   Metastatic cancer (HCC)   Palliative care encounter   Goals of care, counseling/discussion   Near syncope:  Possibly from Afib, [place] vasc SCORE of 3, as he is transitioning to [place], we will not anti coagulate. Resume BB for rate control.    Lung mass suspicious for malignancy:  After long discussion with patient, no further work up wanted by the patient.  Palliative care consulted, pt wanted no work up or treatment at this time.  Plan for residential hospice when bed available.    UTI with cultures showing E fecalis.  Currently on unasyn, transition to ampicillin or nitrofurantoin on discharge.    Severe Protein energy malnutrition:  Supplementation ordered.    Hypertension: well controlled.    Inner buttocks wounds. Wound care consulted and recommendations. Given.    Elevated troponin: Possibly from demand ischemia from afib with RVR.  Currently no further work up.      DVT prophylaxis: SCD's Code Status:DNR, CMO Family Communication: none at bedside discussed the plan of discharge with the patient.  Disposition Plan: residential hospice, awaiting for SW to follow up.    Consultants:   Palliative care consult.   Cardiology.   Procedures: none.    Antimicrobials: UNASYN started on  6/18   Subjective: Reports feeing okay, no new complaints, no chest pain or sob. Eating breakfast.   Objective: Vitals:   12/19/16 1628 12/19/16 2025 12/19/16 2200 12/19/16 2351  BP: 100/65 (!) 107/52  (!) 121/56  Pulse: (!) 59 63 (!) 55 (!) 55  Resp: 16 16  16   Temp: 98.2 F (36.8 C) 98.5 F (36.9 C)    TempSrc: Oral Oral    SpO2: 90% 95%  100%  Weight:      Height:        Intake/Output Summary (Last 24 hours) at 12/20/16 1006 Last data filed at 12/20/16 0100  Gross per 24 hour  Intake              640 ml  Output              600 ml  Net               40 ml   Filed Weights   12/18/16 0912 12/19/16 0647  Weight: 46.7 kg (103 lb) 47.2 kg (104 lb)    Examination:  General exam: cachetic looking gentleman.  Respiratory system: Clear to auscultation. Respiratory effort normal. Cardiovascular system: S1 & S2 heard, RRR.No pedal edema. Gastrointestinal system: Abdomen is nondistended, soft and nontender. No organomegaly or masses felt. Normal bowel sounds heard. Central nervous system: Alert and oriented, very hard of hearing.  Skin: full thickness wounds on the thighs, with foam dressing.  Psychiatry:  Mood & affect  appropriate.     Data Reviewed: I have personally reviewed following labs and imaging studies  CBC:  Recent Labs Lab 12/17/16 2134 12/17/16 2156 12/18/16 0519  WBC 12.6*  --  11.0*  NEUTROABS 10.6*  --   --   HGB 13.2 13.6 11.7*  HCT 41.2 40.0 37.7*  MCV 98.8  --  100.3*  PLT 225  --  595   Basic Metabolic Panel:  Recent Labs Lab 12/17/16 2134 12/17/16 2156 12/18/16 0519  NA 138 138 137  K 4.7 4.6 4.4  CL 103 101 104  CO2 26  --  25  GLUCOSE 115* 114* 86  BUN 35* 36* 33*  CREATININE 1.22 1.20 1.08  CALCIUM 9.2  --  8.4*  MG 2.2  --   --    GFR: Estimated Creatinine Clearance: 29.1 mL/min (by C-G formula based on SCr of 1.08 mg/dL). Liver Function Tests:  Recent Labs Lab 12/17/16 2134  AST 18  ALT 17  ALKPHOS 102  BILITOT  0.5  PROT 6.4*  ALBUMIN 3.2*   No results for input(s): LIPASE, AMYLASE in the last 168 hours. No results for input(s): AMMONIA in the last 168 hours. Coagulation Profile: No results for input(s): INR, PROTIME in the last 168 hours. Cardiac Enzymes:  Recent Labs Lab 12/17/16 2134 12/17/16 2359 12/18/16 0519 12/18/16 1157  TROPONINI 0.07* 0.13* 0.07* 0.06*   BNP (last 3 results) No results for input(s): PROBNP in the last 8760 hours. HbA1C: No results for input(s): HGBA1C in the last 72 hours. CBG:  Recent Labs Lab 12/18/16 0633 12/18/16 0655 12/18/16 0734 12/19/16 0744  GLUCAP 57* 61* 89 79   Lipid Profile: No results for input(s): CHOL, HDL, LDLCALC, TRIG, CHOLHDL, LDLDIRECT in the last 72 hours. Thyroid Function Tests: No results for input(s): TSH, T4TOTAL, FREET4, T3FREE, THYROIDAB in the last 72 hours. Anemia Panel: No results for input(s): VITAMINB12, FOLATE, FERRITIN, TIBC, IRON, RETICCTPCT in the last 72 hours. Sepsis Labs: No results for input(s): PROCALCITON, LATICACIDVEN in the last 168 hours.  Recent Results (from the past 240 hour(s))  MRSA PCR Screening     Status: None   Collection Time: 12/18/16 10:35 AM  Result Value Ref Range Status   MRSA by PCR NEGATIVE NEGATIVE Final    Comment:        The GeneXpert MRSA Assay (FDA approved for NASAL specimens only), is one component of a comprehensive MRSA colonization surveillance program. It is not intended to diagnose MRSA infection nor to guide or monitor treatment for MRSA infections.          Radiology Studies: No results found.      Scheduled Meds: . aspirin  325 mg Oral Daily  . collagenase   Topical Daily  . feeding supplement  1 Container Oral TID BM  . loratadine  10 mg Oral Daily  . metoprolol tartrate  12.5 mg Oral BID  . sodium chloride flush  3 mL Intravenous Q12H  . sodium chloride flush  3 mL Intravenous Q12H   Continuous Infusions: . sodium chloride    .  ampicillin-sulbactam (UNASYN) IV Stopped (12/19/16 2330)     LOS: 2 days    Time spent: 35 minutes.     Hosie Poisson, MD [facility] Pager [phone]   If 7PM-7AM, please contact night-coverage [email or web address] Password [email or web address] 12/20/2016, 10:06 AM

## 2016-12-20 NOTE — Progress Notes (Signed)
  Echocardiogram 2D Echocardiogram has been performed.  Bruce Webster 12/20/2016, 12:13 PM

## 2016-12-21 DIAGNOSIS — C799 Secondary malignant neoplasm of unspecified site: Secondary | ICD-10-CM | POA: Diagnosis not present

## 2016-12-21 DIAGNOSIS — I4891 Unspecified atrial fibrillation: Secondary | ICD-10-CM | POA: Diagnosis not present

## 2016-12-21 DIAGNOSIS — I10 Essential (primary) hypertension: Secondary | ICD-10-CM | POA: Diagnosis not present

## 2016-12-21 DIAGNOSIS — R2681 Unsteadiness on feet: Secondary | ICD-10-CM | POA: Diagnosis not present

## 2016-12-21 DIAGNOSIS — R0600 Dyspnea, unspecified: Secondary | ICD-10-CM | POA: Diagnosis not present

## 2016-12-21 DIAGNOSIS — Z515 Encounter for palliative care: Secondary | ICD-10-CM | POA: Diagnosis not present

## 2016-12-21 DIAGNOSIS — E440 Moderate protein-calorie malnutrition: Secondary | ICD-10-CM | POA: Diagnosis not present

## 2016-12-21 DIAGNOSIS — I480 Paroxysmal atrial fibrillation: Secondary | ICD-10-CM | POA: Diagnosis not present

## 2016-12-21 DIAGNOSIS — Q333 Agenesis of lung: Secondary | ICD-10-CM | POA: Diagnosis not present

## 2016-12-21 DIAGNOSIS — M6281 Muscle weakness (generalized): Secondary | ICD-10-CM | POA: Diagnosis not present

## 2016-12-21 DIAGNOSIS — J069 Acute upper respiratory infection, unspecified: Secondary | ICD-10-CM | POA: Diagnosis not present

## 2016-12-21 DIAGNOSIS — R41841 Cognitive communication deficit: Secondary | ICD-10-CM | POA: Diagnosis not present

## 2016-12-21 DIAGNOSIS — I482 Chronic atrial fibrillation: Secondary | ICD-10-CM | POA: Diagnosis not present

## 2016-12-21 DIAGNOSIS — N390 Urinary tract infection, site not specified: Secondary | ICD-10-CM | POA: Diagnosis not present

## 2016-12-21 DIAGNOSIS — R55 Syncope and collapse: Secondary | ICD-10-CM | POA: Diagnosis not present

## 2016-12-21 DIAGNOSIS — R1310 Dysphagia, unspecified: Secondary | ICD-10-CM | POA: Diagnosis not present

## 2016-12-21 DIAGNOSIS — R627 Adult failure to thrive: Secondary | ICD-10-CM | POA: Diagnosis not present

## 2016-12-21 DIAGNOSIS — L89152 Pressure ulcer of sacral region, stage 2: Secondary | ICD-10-CM | POA: Diagnosis not present

## 2016-12-21 DIAGNOSIS — R498 Other voice and resonance disorders: Secondary | ICD-10-CM | POA: Diagnosis not present

## 2016-12-21 DIAGNOSIS — C801 Malignant (primary) neoplasm, unspecified: Secondary | ICD-10-CM | POA: Diagnosis not present

## 2016-12-21 DIAGNOSIS — J302 Other seasonal allergic rhinitis: Secondary | ICD-10-CM | POA: Diagnosis not present

## 2016-12-21 MED ORDER — LORATADINE 10 MG PO TABS: 10.0000 mg | ORAL_TABLET | Freq: Every day | ORAL | Status: AC

## 2016-12-21 MED ORDER — METOPROLOL TARTRATE 25 MG PO TABS: 12.5000 mg | ORAL_TABLET | Freq: Two times a day (BID) | ORAL | Status: AC

## 2016-12-21 MED ORDER — COLLAGENASE 250 UNIT/GM EX OINT: TOPICAL_OINTMENT | Freq: Every day | CUTANEOUS | 0 refills | Status: AC

## 2016-12-21 MED ORDER — BOOST / RESOURCE BREEZE PO LIQD: 1.0000 | Freq: Three times a day (TID) | ORAL | 0 refills | Status: AC

## 2016-12-21 MED ORDER — ASPIRIN 325 MG PO TABS: 325.0000 mg | ORAL_TABLET | Freq: Every day | ORAL | Status: AC

## 2016-12-21 MED ORDER — HALOPERIDOL LACTATE 2 MG/ML PO CONC: 1.0000 mg | Freq: Three times a day (TID) | ORAL | 0 refills | Status: AC | PRN

## 2016-12-21 MED ORDER — MORPHINE SULFATE (CONCENTRATE) 10 MG/0.5ML PO SOLN: 5.0000 mg | ORAL | 0 refills | Status: AC | PRN

## 2016-12-21 MED ORDER — AMOXICILLIN-POT CLAVULANATE 875-125 MG PO TABS: 1.0000 | ORAL_TABLET | Freq: Two times a day (BID) | ORAL | 0 refills | Status: AC

## 2016-12-21 MED ORDER — BENZONATATE 200 MG PO CAPS: 200.0000 mg | ORAL_CAPSULE | Freq: Three times a day (TID) | ORAL | 0 refills | Status: AC | PRN

## 2016-12-21 NOTE — Plan of Care (Signed)
Problem: Skin Integrity: Goal: Risk for impaired skin integrity will decrease Outcome: Not Met (add Reason) Pt still unable to mobilize, current  Skin integrity with stage 2 ulcer to buttocks and ulcer to spine   Problem: Activity: Goal: Risk for activity intolerance will decrease Outcome: Not Met (add Reason) Will be discharged to SNF

## 2016-12-21 NOTE — Progress Notes (Signed)
Physical Therapy Treatment Patient Details Name: Bruce Webster MRN: [id number] DOB: Nov 07, 1922 Today's Date: 12/21/2016    History of Present Illness 83 y.o. male admitted on 12/17/16 for near syncope.  Pt dx with hypotension, new onset A-fib with RVR (demand ischemia elevated troponin), and lung mass (as seen on CT scan), acute UTI, and severe protein malnutrition. Pt with significant PMH of HTN, bladder CA, and R femur IM Nail (s/p fall in 2016).    PT Comments    Pt progressing slowly.  Emphasis placed on transfer safety and gait stability/endurance.  Pt's sats maintained at mid 90% level overall.   Follow Up Recommendations  SNF     Equipment Recommendations  None recommended by PT    Recommendations for Other Services       Precautions / Restrictions Precautions Precautions: Fall Precaution Comments: caregiver reports frequent falls    Mobility  Bed Mobility               General bed mobility comments: up going to the bathroom with tech on arrival  Transfers Overall transfer level: Needs assistance Equipment used: Rolling walker (2 wheeled) Transfers: Sit to/from Stand Sit to Stand: Min assist         General transfer comment: assist to come forward more than up.  Ambulation/Gait Ambulation/Gait assistance: Min guard Ambulation Distance (Feet): 150 Feet Assistive device: Rolling walker (2 wheeled) Gait Pattern/deviations: Step-through pattern;Decreased stance time - right;Decreased stance time - left Gait velocity: decreased Gait velocity interpretation: Below normal speed for age/gender General Gait Details: Pt ambulated on RA with sats maintained up as high as 95% with EHR 72 bpm.  Pt reporting starting to get tired.  Pt mildly unsteady with tendency to want to list right.   Stairs            Wheelchair Mobility    Modified Rankin (Stroke Patients Only)       Balance Overall balance assessment: Needs assistance   Sitting  balance-Leahy Scale: Fair       Standing balance-Leahy Scale: Poor Standing balance comment: needs RW or external support for balance                            Cognition Arousal/Alertness: Awake/alert Behavior During Therapy: WFL for tasks assessed/performed Overall Cognitive Status: History of cognitive impairments - at baseline                       Memory: Decreased recall of precautions;Decreased short-term memory                Exercises      General Comments        Pertinent Vitals/Pain Pain Assessment: Faces Faces Pain Scale: No hurt    Home Living                      Prior Function            PT Goals (current goals can now be found in the care plan section) Acute Rehab PT Goals Patient Stated Goal: to return home Time For Goal Achievement: 01/02/17 Potential to Achieve Goals: Good Progress towards PT goals: Progressing toward goals    Frequency    Min 3X/week      PT Plan Current plan remains appropriate    Co-evaluation              AM-PAC PT "6 Clicks" Daily  Activity  Outcome Measure  Difficulty turning over in bed (including adjusting bedclothes, sheets and blankets)?: Total Difficulty moving from lying on back to sitting on the side of the bed? : Total Difficulty sitting down on and standing up from a chair with arms (e.g., wheelchair, bedside commode, etc,.)?: Total Help needed moving to and from a bed to chair (including a wheelchair)?: A Little Help needed walking in hospital room?: A Little Help needed climbing 3-5 steps with a railing? : A Little 6 Click Score: 12    End of Session   Activity Tolerance: Patient tolerated treatment well;Patient limited by fatigue Patient left: in bed;with call bell/phone within reach;with bed alarm set Nurse Communication: Mobility status PT Visit Diagnosis: Unsteadiness on feet (R26.81);Muscle weakness (generalized) (M62.81)     Time: 2957-4734 PT  Time Calculation (min) (ACUTE ONLY): 15 min  Charges:  $Gait Training: 8-22 mins                    G Codes:       12/30/16  Donnella Sham, PT [phone]  (pager)   Tessie Fass Kleber Crean 12/30/2016, 2:55 PM

## 2016-12-21 NOTE — Clinical Social Work Placement (Signed)
   CLINICAL SOCIAL WORK PLACEMENT  NOTE  Date:  12/21/2016  Patient Details  Name: Bruce Webster MRN: [id number] Date of Birth: 29-May-1923  Clinical Social Work is seeking post-discharge placement for this patient at the [place] level of care (*CSW will initial, date and re-position this form in  chart as items are completed):  Yes   Patient/family provided with [facility] Department's list of facilities offering this level of care within the geographic area requested by the patient (or if unable, by the patient's family).  Yes   Patient/family informed of their freedom to choose among providers that offer the needed level of care, that participate in Medicare, Medicaid or managed care program needed by the patient, have an available bed and are willing to accept the patient.  Yes   Patient/family informed of [facility]'s ownership interest in [facility] and [facility], as well as of the fact that they are under no obligation to receive care at these facilities.  PASRR submitted to EDS on 12/19/16     PASRR number received on 12/19/16     Existing PASRR number confirmed on       FL2 transmitted to all facilities in geographic area requested by pt/family on 12/19/16     FL2 transmitted to all facilities within larger geographic area on 12/19/16     Patient informed that his/her managed care company has contracts with or will negotiate with certain facilities, including the following:  [facility]     Yes   Patient/family informed of bed offers received.  Patient chooses bed at [place] [facility]     Physician recommends and patient chooses bed at [place] [facility]    Patient to be transferred to [place] [facility] on 12/21/16.  Patient to be transferred to facility by [facility]     Patient family notified on 12/21/16 of transfer.  Name of family member notified:  [name]     PHYSICIAN Please prepare  priority discharge summary, including medications     Additional Comment:    _______________________________________________ Estanislado Emms, LCSW 12/21/2016, 10:37 AM

## 2016-12-21 NOTE — Progress Notes (Signed)
Report called to [facility] in [employer] took report.  All belongs gathered, IV discontinued.  Patients relative Cecille Rubin notified of transport at 1500.

## 2016-12-21 NOTE — Progress Notes (Signed)
Talked with patient's surrogate decision maker multiple times this am about disposition.  Examined patient.  He is alert and eating well, but having increased work of breathing and dyspnea.    Spoke again in combination with CSW, Manuela Schwartz, to patient's surrogate [name]).  The final plan was to discharge the patient to [facility] SNF with hospice services.   Imogene Burn, PA-C Palliative Medicine Pager: [phone]  Total time 15 min.

## 2016-12-21 NOTE — Consult Note (Addendum)
           [facility] CM Primary Care Navigator  12/21/2016  BENGIE KAUCHER 30-Jun-1923 [id number]   Went to see patient at the bedside to identify possible discharge needs but he was already discharged. Patient was discharged to skilled nursing facility ([facility]) today.  Patient was admitted for a near syncopal episode and was found to have A-fib with RVR and UTI with CT- chest showed evidence of lung mass suspicious for malignancy. Patient wanted no work up or treatment at this time.   Palliative Care consult was made with final plan to discharge patient to [facility] skilled nursing facility with Hospice services. Primary care provider's office called Levada Dy for [facility]) and notified of discharge disposition.  No further health management needed at this time.   For questions, please contact:  Dannielle Huh, BSN, RN- [facility] Primary Care Navigator  Telephone: [phone] [place]

## 2016-12-21 NOTE — Discharge Summary (Signed)
Physician Discharge Summary  Bruce Webster IRW:[id number] DOB: 20-Jul-1922 DOA: 12/17/2016  PCP: Asencion Noble, MD  Admit date: 12/17/2016 Discharge date: 12/21/2016  Admitted From: Home Disposition:  Home  Recommendations for Outpatient Follow-up:  1. Follow up with PCP in 1-2 weeks 2. Please obtain BMP/CBC in one week 3. Please follow up with hospice services at the facility.     Discharge Condition:stable.  CODE STATUS: DNR Diet recommendation: Heart Healthy   Brief/Interim Summary: 83 year old  Admitted for a near syncope episode. Was found to have a fib with RVR and UTI, and CT chest showed evidence of lung mass. He was started on IV antibiotics. Meanwhile a palliative care consult put it and plan to transition to comfort care.  Transition to oral antibiotic on discharge.    Discharge Diagnoses:  Principal Problem:   Near syncope Active Problems:   HTN (hypertension)   New onset a-fib (HCC)   Atrial fibrillation with RVR (HCC)   Cough   Sacral decubitus ulcer   Metastatic cancer ([place])   Palliative care encounter   Goals of care, counseling/discussion  Near syncope:  Possibly from Afib, [place] vasc SCORE of 3, as he is transitioning to [place], we will not anti coagulate. Resume BB for rate control.    Lung mass suspicious for malignancy:  After long discussion with patient, no further work up wanted by the patient.  Palliative care consulted, pt wanted no work up or treatment at this time.  Plan for residential hospice when bed available.    UTI with cultures showing E fecalis.  Currently on unasyn, transition to ampicillin  on discharge.    Severe Protein energy malnutrition:  Supplementation ordered.    Hypertension: well controlled.    Inner buttocks wounds. Wound care consulted and recommendations. Given.    Elevated troponin: Possibly from demand ischemia from afib with RVR.  Currently no further work up.    Discharge  Instructions  Discharge Instructions    Diet - low sodium heart healthy    Complete by:  As directed    Discharge instructions    Complete by:  As directed    Please follow up with palliative care services at the facility.     Allergies as of 12/21/2016   No Known Allergies     Medication List    STOP taking these medications   cetirizine 10 MG tablet Commonly known as:  ZYRTEC Replaced by:  loratadine 10 MG tablet   HYDROcodone-acetaminophen 5-325 MG tablet Commonly known as:  NORCO/VICODIN   lisinopril 10 MG tablet Commonly known as:  PRINIVIL,ZESTRIL     TAKE these medications   amoxicillin-clavulanate 875-125 MG tablet Commonly known as:  AUGMENTIN Take 1 tablet by mouth 2 (two) times daily.   aspirin 325 MG tablet Take 1 tablet (325 mg total) by mouth daily.   benzonatate 200 MG capsule Commonly known as:  TESSALON Take 1 capsule (200 mg total) by mouth 3 (three) times daily as needed for cough.   cholecalciferol 1000 units tablet Commonly known as:  VITAMIN D Take 1,000 Units by mouth daily.   collagenase ointment Commonly known as:  SANTYL Apply topically daily.   feeding supplement Liqd Take 1 Container by mouth 3 (three) times daily between meals.   haloperidol 2 MG/ML solution Commonly known as:  HALDOL Take 0.5 mLs (1 mg total) by mouth every 8 (eight) hours as needed for agitation (anxiety).   loratadine 10 MG tablet Commonly known as:  CLARITIN  Take 1 tablet (10 mg total) by mouth daily. Replaces:  cetirizine 10 MG tablet   metoprolol tartrate 25 MG tablet Commonly known as:  LOPRESSOR Take 0.5 tablets (12.5 mg total) by mouth 2 (two) times daily.   morphine CONCENTRATE 10 MG/0.5ML Soln concentrated solution Take 0.25 mLs (5 mg total) by mouth every hour as needed for severe pain or shortness of breath.      Contact information for after-discharge care    Destination    [facility] Follow up.   Specialty:  Skilled  Nursing Facility Contact information: [address] [place] [phone]             No Known Allergies  Consultations:  Palliative care  Cardiology.    Procedures/Studies: Dg Chest 2 View  Result Date: 11/27/2016 CLINICAL DATA:  83 year old male with fall and trauma to the back. EXAM: CHEST  2 VIEW COMPARISON:  Chest radiograph dated 10/23/2016 FINDINGS: There is emphysematous changes of the lungs with hyperinflation and flattening of the diaphragms. Focal area of mild hazy density at the posterior costophrenic angles seen on the lateral projection may represent an area of atelectatic changes. Pneumonia is less likely. Clinical correlation is recommended. There is no pleural effusion or pneumothorax. The cardiac silhouette is within normal limits. The aorta is tortuous. There is osteopenia with degenerative changes of the spine. No acute fracture. IMPRESSION: 1. No acute cardiopulmonary process. 2. COPD changes. Electronically Signed   By: Anner Crete M.D.   On: 11/27/2016 19:09   Dg Lumbar Spine Complete  Result Date: 11/27/2016 CLINICAL DATA:  Low back pain after fall. EXAM: LUMBAR SPINE - COMPLETE 4+ VIEW COMPARISON:  CT scan of April 27, 2016. FINDINGS: Mild grade 1 anterolisthesis of L5-S1 is noted secondary to bilateral pars defects of L5. Minimal grade 1 retrolisthesis of L1-2 is noted secondary to severe degenerative disc disease at this level. Degenerative disc disease is also noted at L2-3 with anterior osteophyte formation. No acute fracture is noted. IMPRESSION: Multilevel degenerative disc disease. Mild grade 1 anterolisthesis of L5-S1 secondary to bilateral pars defects of L5. No acute abnormality seen in the lumbar spine. Electronically Signed   By: Marijo Conception, M.D.   On: 11/27/2016 19:09   Ct Head Wo Contrast  Result Date: 11/27/2016 CLINICAL DATA:  83 year old male with fall. EXAM: CT HEAD WITHOUT CONTRAST CT CERVICAL SPINE  WITHOUT CONTRAST TECHNIQUE: Multidetector CT imaging of the head and cervical spine was performed following the standard protocol without intravenous contrast. Multiplanar CT image reconstructions of the cervical spine were also generated. COMPARISON:  None. FINDINGS: CT HEAD FINDINGS Brain: There is moderate age-related atrophy and chronic microvascular ischemic changes. There is no acute intracranial hemorrhage. No mass effect or midline shift noted. No extra-axial fluid collection. Vascular: No hyperdense vessel or unexpected calcification. Skull: Normal. Negative for fracture or focal lesion. Sinuses/Orbits: No acute finding. Other: Postsurgical changes of left mastoidectomy. CT CERVICAL SPINE FINDINGS Alignment: No acute subluxation. Skull base and vertebrae: No acute fracture. The bones are osteopenic. Multiple lucent lesions throughout the spine likely related to osteopenia. Soft tissues and spinal canal: No prevertebral fluid or swelling. No visible canal hematoma. Disc levels: Multilevel degenerative changes and disc disease most pronounced at C4-C5, C6-C7, and C7-T1. Upper chest: Biapical pleural thickening and scarring. Other: Bilateral carotid bulb atherosclerotic plaques. IMPRESSION: 1. No acute intracranial hemorrhage. Moderate age-related atrophy and chronic microvascular ischemic changes. 2. No acute/traumatic cervical spine pathology. Chronic degenerative changes.  Electronically Signed   By: Anner Crete M.D.   On: 11/27/2016 19:29   Ct Chest W Contrast  Result Date: 12/18/2016 CLINICAL DATA:  Cough.  History of hypertension and cancer. EXAM: CT CHEST WITH CONTRAST TECHNIQUE: Multidetector CT imaging of the chest was performed during intravenous contrast administration. CONTRAST:  12mL ISOVUE-300 IOPAMIDOL (ISOVUE-300) INJECTION 61% COMPARISON:  Chest radiograph 12/17/2016 FINDINGS: Cardiovascular: There is extensive atherosclerotic plaque throughout the thoracic aorta. The heart size is  normal. There are coronary artery calcifications. Mediastinum/Nodes: There is a large left hilar/ mediastinal mass encasing the distal trachea and left mainstem bronchus and extending along the left lower lobe bronchus. The mass measures approximately 8 x 6 cm. This mass also abuts 180 degrees of both left pulmonary veins. The mass encases the left pulmonary artery and narrows the proximal left upper and lower lobar arteries. There is another upper mediastinal mass that abuts the superior vena cava, markedly narrowing at, and also abuts the brachiocephalic artery. This mass measures 3.2 x 2.6 cm. Lungs/Pleura: Severe emphysema. 7 mm right middle lobe nodule, 4 mm right upper lobe nodule, 6 mm right lower lobe perifissural nodule. A pleural-based left lower lobe nodule measures 5 mm. No pleural effusion. There is debris layering within the lower trachea. Upper Abdomen: Severe bilateral renal cortical thinning. Right upper pole renal cyst measures 3 cm. The liver is heterogeneous with a nodular contour and multiple areas of hypoattenuation. Musculoskeletal: Multilevel thoracic osteophytosis. No focal lytic lesions are identified. There is an old fracture of the right eleventh rib. IMPRESSION: 1. Large mediastinal and left hilar mass encasing the lower trachea, left mainstem bronchus and left lower lobe bronchus. The mass encases the left main pulmonary artery and left upper and lower lobar arteries. The mass also abuts the right main pulmonary artery, right upper lobar artery and both left pulmonary veins. 2. Right upper mediastinal mass compressing the superior vena cava and abutting the right brachiocephalic artery. 3. Multiple right-sided pulmonary nodules, concerning for neoplastic/metastatic lesions. At a minimum, follow-up chest CT in 3 months is recommended. 4. Heterogeneous appearance of the liver with multiple areas of hypoattenuation. Hepatic metastatic disease would be difficult to exclude. This could be  further evaluated with MRI, if clinically indicated. 5. Aortic Atherosclerosis (ICD10-I70.0) and Emphysema (ICD10-J43.9). Electronically Signed   By: Ulyses Jarred M.D.   On: 12/18/2016 00:07   Ct Cervical Spine Wo Contrast  Result Date: 11/27/2016 CLINICAL DATA:  83 year old male with fall. EXAM: CT HEAD WITHOUT CONTRAST CT CERVICAL SPINE WITHOUT CONTRAST TECHNIQUE: Multidetector CT imaging of the head and cervical spine was performed following the standard protocol without intravenous contrast. Multiplanar CT image reconstructions of the cervical spine were also generated. COMPARISON:  None. FINDINGS: CT HEAD FINDINGS Brain: There is moderate age-related atrophy and chronic microvascular ischemic changes. There is no acute intracranial hemorrhage. No mass effect or midline shift noted. No extra-axial fluid collection. Vascular: No hyperdense vessel or unexpected calcification. Skull: Normal. Negative for fracture or focal lesion. Sinuses/Orbits: No acute finding. Other: Postsurgical changes of left mastoidectomy. CT CERVICAL SPINE FINDINGS Alignment: No acute subluxation. Skull base and vertebrae: No acute fracture. The bones are osteopenic. Multiple lucent lesions throughout the spine likely related to osteopenia. Soft tissues and spinal canal: No prevertebral fluid or swelling. No visible canal hematoma. Disc levels: Multilevel degenerative changes and disc disease most pronounced at C4-C5, C6-C7, and C7-T1. Upper chest: Biapical pleural thickening and scarring. Other: Bilateral carotid bulb atherosclerotic plaques. IMPRESSION: 1. No acute intracranial  hemorrhage. Moderate age-related atrophy and chronic microvascular ischemic changes. 2. No acute/traumatic cervical spine pathology. Chronic degenerative changes. Electronically Signed   By: Anner Crete M.D.   On: 11/27/2016 19:29   Dg Chest Port 1 View  Result Date: 12/17/2016 CLINICAL DATA:  Cough and dizziness. Productive cough with clear sputum.  EXAM: PORTABLE CHEST 1 VIEW COMPARISON:  11/27/2016 FINDINGS: Stable hyperinflation. Normal heart size with stable tortuosity and atherosclerosis of the thoracic aorta. No consolidation, pulmonary edema, pleural effusion or pneumothorax. Stable osseous structures. IMPRESSION: 1. No acute abnormality. 2. Stable hyperinflation suggesting COPD. 3. Atherosclerosis of the thoracic aorta. Electronically Signed   By: Jeb Levering M.D.   On: 12/17/2016 22:00   Dg Hip Unilat W Or Wo Pelvis 2-3 Views Right  Result Date: 11/27/2016 CLINICAL DATA:  Fall. EXAM: DG HIP (WITH OR WITHOUT PELVIS) 2-3V RIGHT COMPARISON:  None. FINDINGS: Status post surgical internal fixation of old proximal right femur fracture. Intramedullary rod fixation of right femoral shaft is noted as well. No acute fracture or dislocation is noted. Vascular calcifications are noted. IMPRESSION: Postsurgical changes as described above. No acute abnormality seen in the right hip. Electronically Signed   By: Marijo Conception, M.D.   On: 11/27/2016 19:11      Subjective: No chest pain or sob, no nausea, or vomiting, no abdominal pain. No headache or dizziness.   Discharge Exam: Vitals:   12/21/16 0315 12/21/16 0800  BP: 140/70   Pulse: (!) 56   Resp:  14  Temp: 97.8 F (36.6 C)    Vitals:   12/20/16 1304 12/20/16 2345 12/21/16 0315 12/21/16 0800  BP: (!) 143/60  140/70   Pulse: (!) 57 (!) 54 (!) 56   Resp: 20   14  Temp: 97.8 F (36.6 C)  97.8 F (36.6 C)   TempSrc: Axillary  Oral   SpO2: 96% 100% 97%   Weight:   49.5 kg (109 lb 3.2 oz)   Height:        General: Pt is alert, awake, not in acute distress Cardiovascular: RRR, S1/S2 +, no rubs, no gallops Respiratory: rhonchi bilateral, diminished air entry.  Abdominal: Soft, NT, ND, bowel sounds + Extremities: no edema, no cyanosis    The results of significant diagnostics from this hospitalization (including imaging, microbiology, ancillary and laboratory) are listed  below for reference.     Microbiology: Recent Results (from the past 240 hour(s))  MRSA PCR Screening     Status: None   Collection Time: 12/18/16 10:35 AM  Result Value Ref Range Status   MRSA by PCR NEGATIVE NEGATIVE Final    Comment:        The GeneXpert MRSA Assay (FDA approved for NASAL specimens only), is one component of a comprehensive MRSA colonization surveillance program. It is not intended to diagnose MRSA infection nor to guide or monitor treatment for MRSA infections.      Labs: BNP (last 3 results) No results for input(s): BNP in the last 8760 hours. Basic Metabolic Panel:  Recent Labs Lab 12/17/16 2134 12/17/16 2156 12/18/16 0519  NA 138 138 137  K 4.7 4.6 4.4  CL 103 101 104  CO2 26  --  25  GLUCOSE 115* 114* 86  BUN 35* 36* 33*  CREATININE 1.22 1.20 1.08  CALCIUM 9.2  --  8.4*  MG 2.2  --   --    Liver Function Tests:  Recent Labs Lab 12/17/16 2134  AST 18  ALT 17  ALKPHOS 102  BILITOT 0.5  PROT 6.4*  ALBUMIN 3.2*   No results for input(s): LIPASE, AMYLASE in the last 168 hours. No results for input(s): AMMONIA in the last 168 hours. CBC:  Recent Labs Lab 12/17/16 2134 12/17/16 2156 12/18/16 0519  WBC 12.6*  --  11.0*  NEUTROABS 10.6*  --   --   HGB 13.2 13.6 11.7*  HCT 41.2 40.0 37.7*  MCV 98.8  --  100.3*  PLT 225  --  204   Cardiac Enzymes:  Recent Labs Lab 12/17/16 2134 12/17/16 2359 12/18/16 0519 12/18/16 1157  TROPONINI 0.07* 0.13* 0.07* 0.06*   BNP: Invalid input(s): POCBNP CBG:  Recent Labs Lab 12/18/16 0633 12/18/16 0655 12/18/16 0734 12/19/16 0744  GLUCAP 57* 61* 89 79   D-Dimer No results for input(s): DDIMER in the last 72 hours. Hgb A1c No results for input(s): HGBA1C in the last 72 hours. Lipid Profile No results for input(s): CHOL, HDL, LDLCALC, TRIG, CHOLHDL, LDLDIRECT in the last 72 hours. Thyroid function studies No results for input(s): TSH, T4TOTAL, T3FREE, THYROIDAB in the last 72  hours.  Invalid input(s): FREET3 Anemia work up No results for input(s): VITAMINB12, FOLATE, FERRITIN, TIBC, IRON, RETICCTPCT in the last 72 hours. Urinalysis    Component Value Date/Time   COLORURINE YELLOW 12/18/2016 0050   APPEARANCEUR HAZY (A) 12/18/2016 0050   LABSPEC 1.036 (H) 12/18/2016 0050   PHURINE 6.0 12/18/2016 0050   GLUCOSEU NEGATIVE 12/18/2016 0050   HGBUR NEGATIVE 12/18/2016 0050   BILIRUBINUR NEGATIVE 12/18/2016 0050   KETONESUR NEGATIVE 12/18/2016 0050   PROTEINUR NEGATIVE 12/18/2016 0050   UROBILINOGEN 0.2 04/23/2015 0433   NITRITE NEGATIVE 12/18/2016 0050   LEUKOCYTESUR LARGE (A) 12/18/2016 0050   Sepsis Labs Invalid input(s): PROCALCITONIN,  WBC,  LACTICIDVEN Microbiology Recent Results (from the past 240 hour(s))  MRSA PCR Screening     Status: None   Collection Time: 12/18/16 10:35 AM  Result Value Ref Range Status   MRSA by PCR NEGATIVE NEGATIVE Final    Comment:        The GeneXpert MRSA Assay (FDA approved for NASAL specimens only), is one component of a comprehensive MRSA colonization surveillance program. It is not intended to diagnose MRSA infection nor to guide or monitor treatment for MRSA infections.      Time coordinating discharge: Over 30 minutes  SIGNED:   Hosie Poisson, MD  [facility] 12/21/2016, 11:31 AM Pager   If 7PM-7AM, please contact night-coverage [email or web address] Password [email or web address]

## 2016-12-21 NOTE — Progress Notes (Signed)
Patient will discharge to [facility]  Anticipated discharge date: 12/21/16 Family notified: [name], friend Transportation by: Corey Harold   CSW signing off.  Estanislado Emms, [place]  Clinical Social Worker

## 2016-12-22 DIAGNOSIS — C801 Malignant (primary) neoplasm, unspecified: Secondary | ICD-10-CM | POA: Diagnosis not present

## 2016-12-22 DIAGNOSIS — N390 Urinary tract infection, site not specified: Secondary | ICD-10-CM | POA: Diagnosis not present

## 2016-12-22 DIAGNOSIS — R0600 Dyspnea, unspecified: Secondary | ICD-10-CM | POA: Diagnosis not present

## 2016-12-22 DIAGNOSIS — J302 Other seasonal allergic rhinitis: Secondary | ICD-10-CM | POA: Diagnosis not present

## 2016-12-25 DIAGNOSIS — L89152 Pressure ulcer of sacral region, stage 2: Secondary | ICD-10-CM | POA: Diagnosis not present

## 2016-12-25 DIAGNOSIS — E440 Moderate protein-calorie malnutrition: Secondary | ICD-10-CM | POA: Diagnosis not present

## 2016-12-25 DIAGNOSIS — R627 Adult failure to thrive: Secondary | ICD-10-CM | POA: Diagnosis not present

## 2016-12-25 DIAGNOSIS — C801 Malignant (primary) neoplasm, unspecified: Secondary | ICD-10-CM | POA: Diagnosis not present

## 2017-01-01 ENCOUNTER — Telehealth: Payer: Self-pay | Admitting: Emergency Medicine

## 2017-01-01 DIAGNOSIS — R627 Adult failure to thrive: Secondary | ICD-10-CM | POA: Diagnosis not present

## 2017-01-01 DIAGNOSIS — E440 Moderate protein-calorie malnutrition: Secondary | ICD-10-CM | POA: Diagnosis not present

## 2017-01-01 DIAGNOSIS — R0600 Dyspnea, unspecified: Secondary | ICD-10-CM | POA: Diagnosis not present

## 2017-01-01 DIAGNOSIS — C801 Malignant (primary) neoplasm, unspecified: Secondary | ICD-10-CM | POA: Diagnosis not present

## 2017-01-01 NOTE — Telephone Encounter (Signed)
LOST TO FOLLOWUP 

## 2017-01-04 DIAGNOSIS — R627 Adult failure to thrive: Secondary | ICD-10-CM | POA: Diagnosis not present

## 2017-01-04 DIAGNOSIS — C801 Malignant (primary) neoplasm, unspecified: Secondary | ICD-10-CM | POA: Diagnosis not present

## 2017-01-04 DIAGNOSIS — L89152 Pressure ulcer of sacral region, stage 2: Secondary | ICD-10-CM | POA: Diagnosis not present

## 2017-01-04 DIAGNOSIS — E440 Moderate protein-calorie malnutrition: Secondary | ICD-10-CM | POA: Diagnosis not present

## 2017-01-10 DIAGNOSIS — J439 Emphysema, unspecified: Secondary | ICD-10-CM | POA: Diagnosis not present

## 2017-01-10 DIAGNOSIS — I4891 Unspecified atrial fibrillation: Secondary | ICD-10-CM | POA: Diagnosis not present

## 2017-01-10 DIAGNOSIS — C3410 Malignant neoplasm of upper lobe, unspecified bronchus or lung: Secondary | ICD-10-CM | POA: Diagnosis not present

## 2017-01-12 DIAGNOSIS — F419 Anxiety disorder, unspecified: Secondary | ICD-10-CM | POA: Diagnosis not present

## 2017-01-12 DIAGNOSIS — R52 Pain, unspecified: Secondary | ICD-10-CM | POA: Diagnosis not present

## 2017-01-12 DIAGNOSIS — R0603 Acute respiratory distress: Secondary | ICD-10-CM | POA: Diagnosis not present

## 2017-01-12 DIAGNOSIS — R0989 Other specified symptoms and signs involving the circulatory and respiratory systems: Secondary | ICD-10-CM | POA: Diagnosis not present

## 2017-01-12 DIAGNOSIS — C801 Malignant (primary) neoplasm, unspecified: Secondary | ICD-10-CM | POA: Diagnosis not present

## 2017-01-12 DIAGNOSIS — C7800 Secondary malignant neoplasm of unspecified lung: Secondary | ICD-10-CM | POA: Diagnosis not present

## 2017-01-12 DIAGNOSIS — Z743 Need for continuous supervision: Secondary | ICD-10-CM | POA: Diagnosis not present

## 2017-01-12 DIAGNOSIS — Z66 Do not resuscitate: Secondary | ICD-10-CM | POA: Diagnosis not present

## 2017-01-12 DIAGNOSIS — R279 Unspecified lack of coordination: Secondary | ICD-10-CM | POA: Diagnosis not present

## 2017-01-12 DIAGNOSIS — R682 Dry mouth, unspecified: Secondary | ICD-10-CM | POA: Diagnosis not present

## 2017-01-13 DIAGNOSIS — R0603 Acute respiratory distress: Secondary | ICD-10-CM | POA: Diagnosis not present

## 2017-01-13 DIAGNOSIS — R682 Dry mouth, unspecified: Secondary | ICD-10-CM | POA: Diagnosis not present

## 2017-01-13 DIAGNOSIS — R52 Pain, unspecified: Secondary | ICD-10-CM | POA: Diagnosis not present

## 2017-01-13 DIAGNOSIS — C801 Malignant (primary) neoplasm, unspecified: Secondary | ICD-10-CM | POA: Diagnosis not present

## 2017-01-13 DIAGNOSIS — C7800 Secondary malignant neoplasm of unspecified lung: Secondary | ICD-10-CM | POA: Diagnosis not present

## 2017-01-13 DIAGNOSIS — F419 Anxiety disorder, unspecified: Secondary | ICD-10-CM | POA: Diagnosis not present

## 2017-01-14 DIAGNOSIS — C7800 Secondary malignant neoplasm of unspecified lung: Secondary | ICD-10-CM | POA: Diagnosis not present

## 2017-01-14 DIAGNOSIS — R682 Dry mouth, unspecified: Secondary | ICD-10-CM | POA: Diagnosis not present

## 2017-01-14 DIAGNOSIS — F419 Anxiety disorder, unspecified: Secondary | ICD-10-CM | POA: Diagnosis not present

## 2017-01-14 DIAGNOSIS — R0603 Acute respiratory distress: Secondary | ICD-10-CM | POA: Diagnosis not present

## 2017-01-14 DIAGNOSIS — C801 Malignant (primary) neoplasm, unspecified: Secondary | ICD-10-CM | POA: Diagnosis not present

## 2017-01-14 DIAGNOSIS — R52 Pain, unspecified: Secondary | ICD-10-CM | POA: Diagnosis not present

## 2017-01-31 DEATH — deceased

## 2017-06-26 IMAGING — CT CT HEAD W/O CM
5 of 7 series · 18 of 47 positions shown, 19 images · non-contrast
Comparison: None.

CLINICAL DATA: [AGE] male with fall.

EXAM:
CT HEAD WITHOUT CONTRAST
CT CERVICAL SPINE WITHOUT CONTRAST
TECHNIQUE: Multidetector CT imaging of the head and cervical spine was
performed following the standard protocol without intravenous
contrast. Multiplanar CT image reconstructions of the cervical spine
were also generated.

[Series 3: head wo · axial · 0.46mm/px · z∈[+42,+117]mm · 3 of 31 slices shown, 4 images]
[im 8/31  brain]
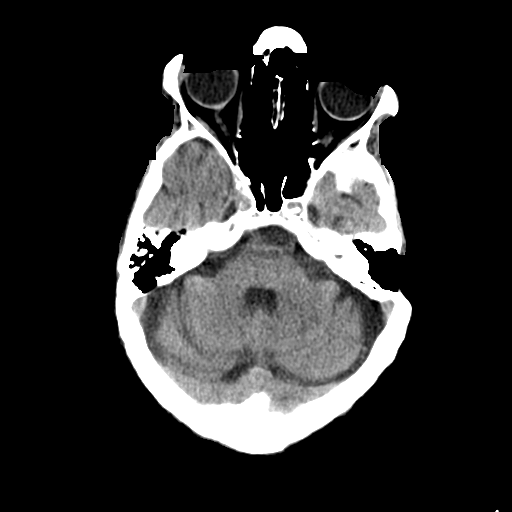
[im 8/31  bone]
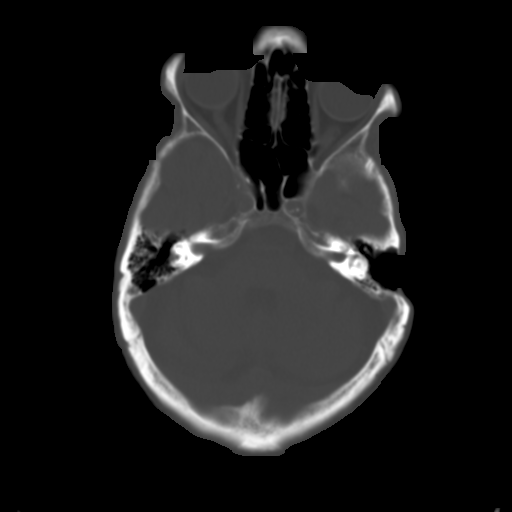
[im 16/31  brain]
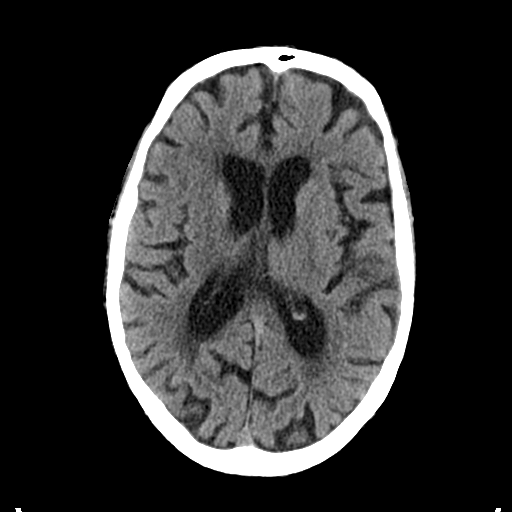
[im 23/31  brain]
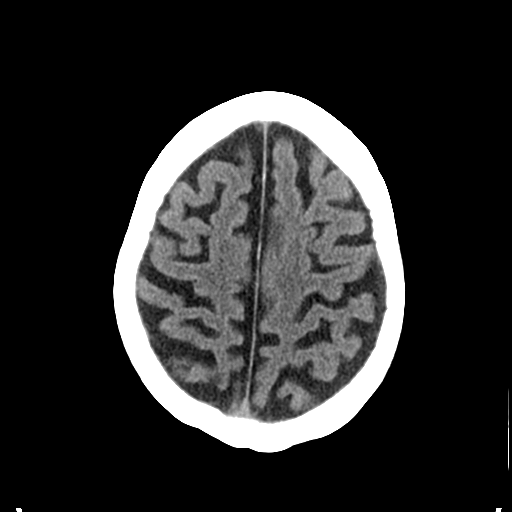

[Series 5: coronal soft tissue · coronal · 0.32mm/px · 3 of 75 slices shown]
[im 19/75  brain]
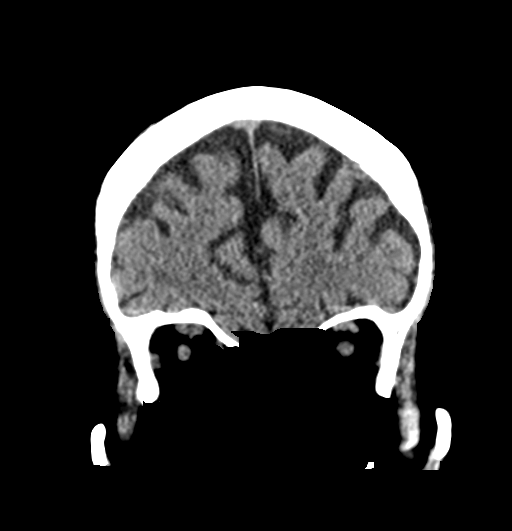
[im 38/75  brain]
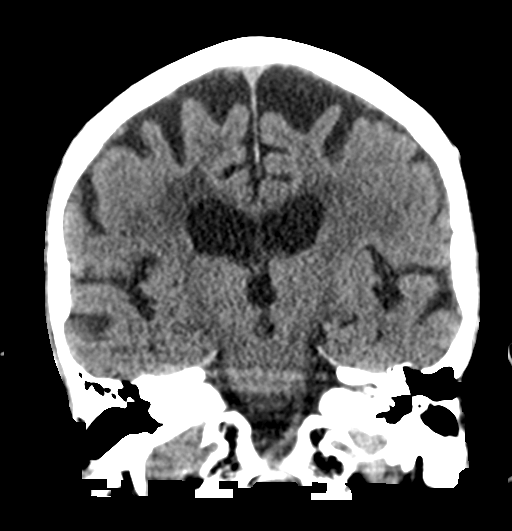
[im 56/75  brain]
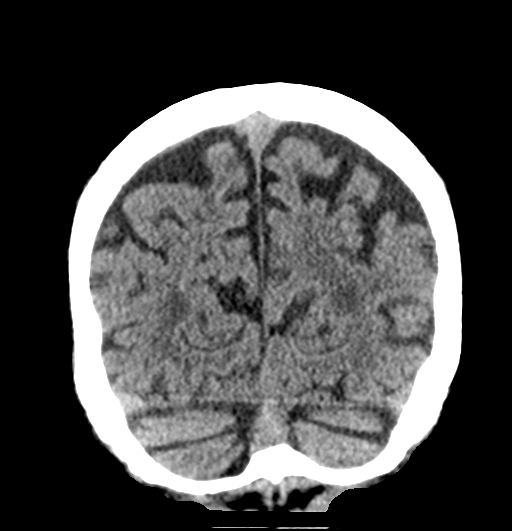

[Series 6: sagittal soft tissue · sagittal · 0.33mm/px · 1 of 50 slices shown]
[im 25/50  brain]
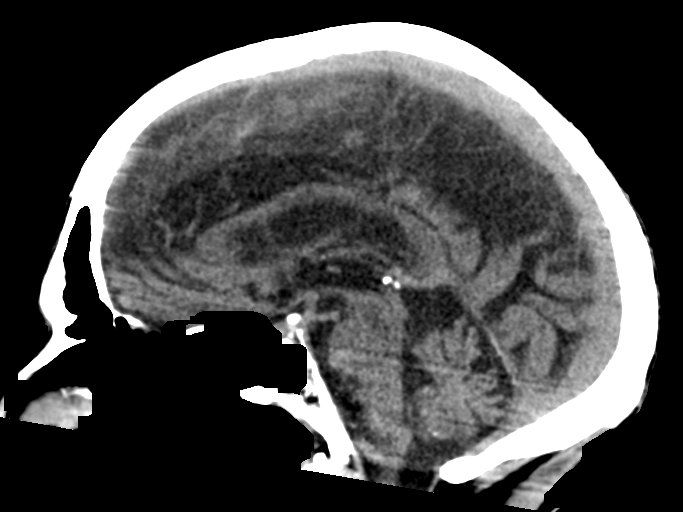

[Series 8: c spine soft · axial · 0.35mm/px · z∈[-128,-86]mm · 3 of 77 slices shown]
[im 7/77  brain]
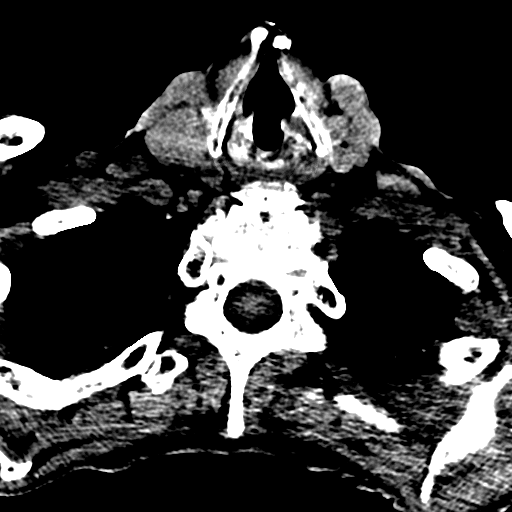
[im 14/77  brain]
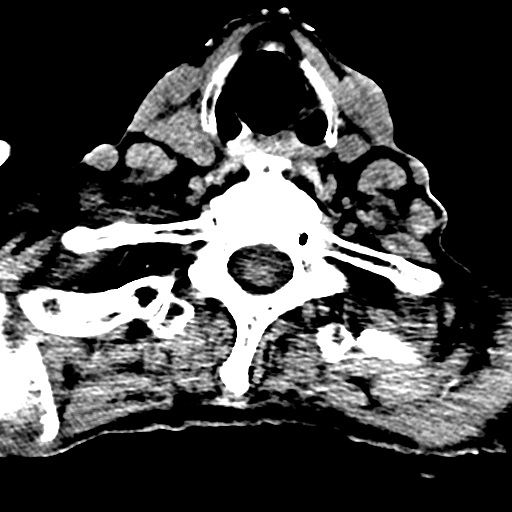
[im 28/77  brain]
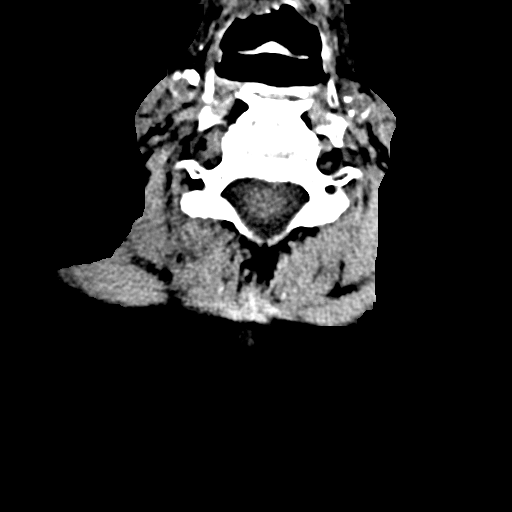

[Series 11: orthogonal bone · axial · 0.21mm/px · z∈[-143,-18]mm · 8 of 84 slices shown]
[im 7/84  bone]
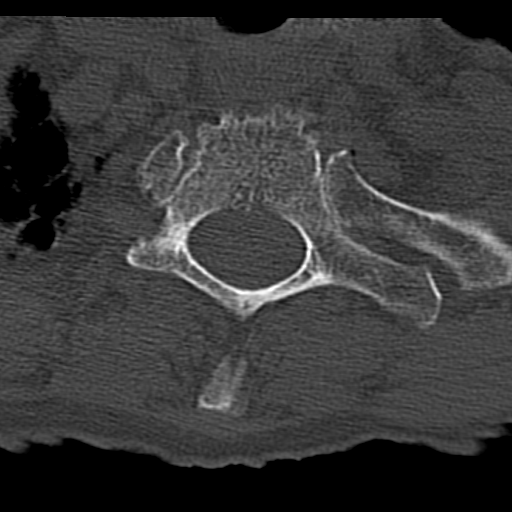
[im 21/84  bone]
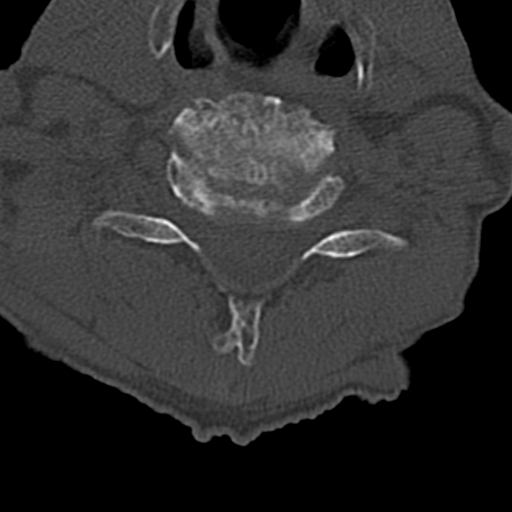
[im 28/84  bone]
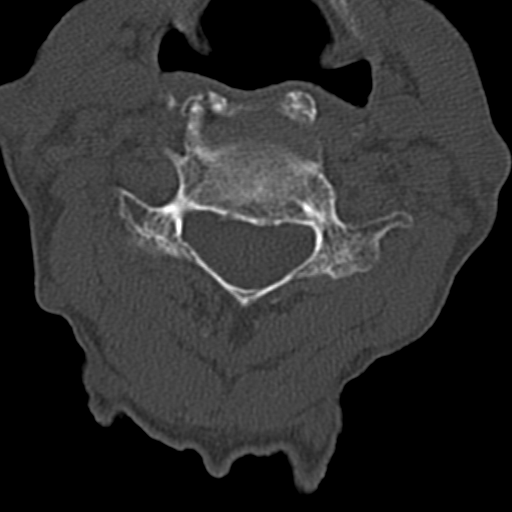
[im 35/84  bone]
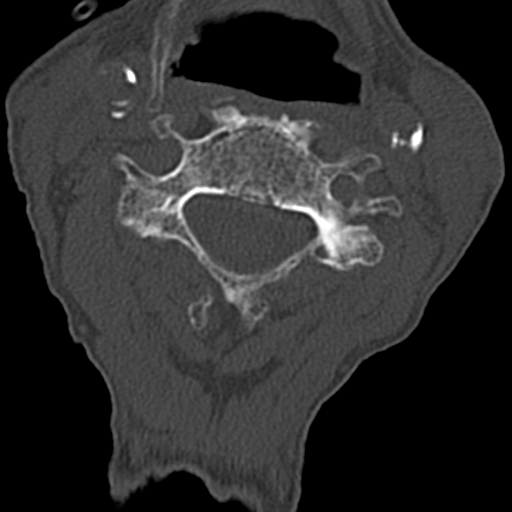
[im 49/84  bone]
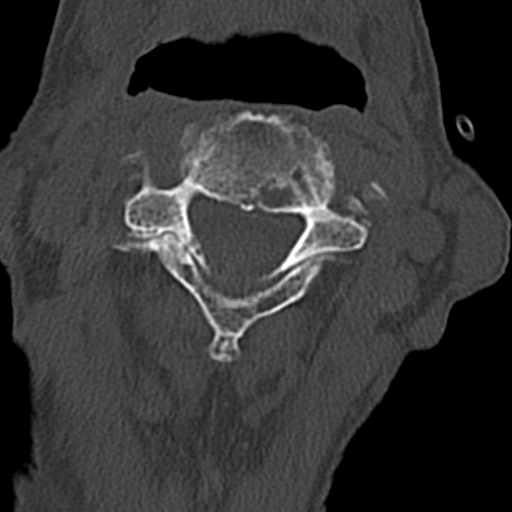
[im 56/84  bone]
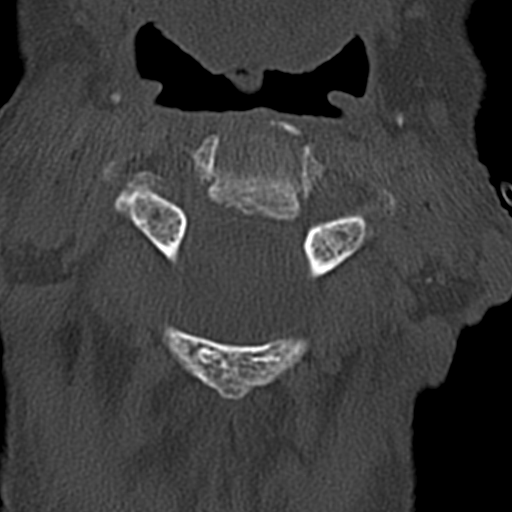
[im 63/84  bone]
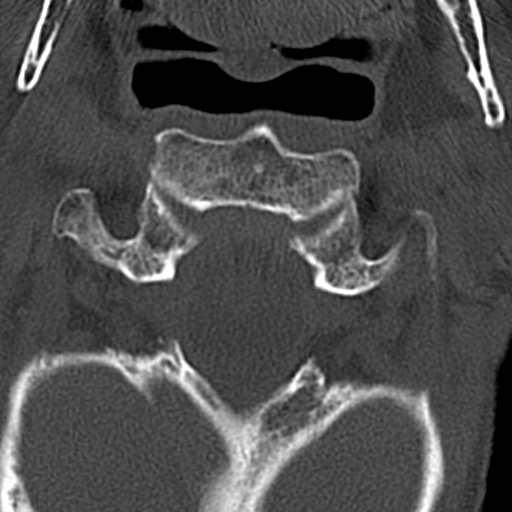
[im 77/84  bone]
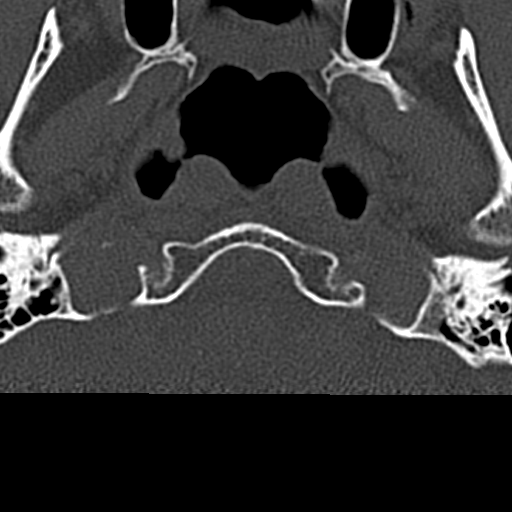

[18 of 47 positions shown; findings below may reference images not displayed]

FINDINGS: CT HEAD FINDINGS

Brain: There is moderate age-related atrophy and chronic
microvascular ischemic changes. There is no acute intracranial
hemorrhage. No mass effect or midline shift noted. No extra-axial
fluid collection.

Vascular: No hyperdense vessel or unexpected calcification.

Skull: Normal. Negative for fracture or focal lesion.

Sinuses/Orbits: No acute finding.

Other: Postsurgical changes of left mastoidectomy.

CT CERVICAL SPINE FINDINGS

Alignment: No acute subluxation.

Skull base and vertebrae: No acute fracture. The bones are
osteopenic. Multiple lucent lesions throughout the spine likely
related to osteopenia.

Soft tissues and spinal canal: No prevertebral fluid or swelling. No
visible canal hematoma.

Disc levels: Multilevel degenerative changes and disc disease most
pronounced at C4-C5, C6-C7, and C7-T1.

Upper chest: Biapical pleural thickening and scarring.

Other: Bilateral carotid bulb atherosclerotic plaques.
IMPRESSION: 1. No acute intracranial hemorrhage. Moderate age-related atrophy
and chronic microvascular ischemic changes.
2. No acute/traumatic cervical spine pathology. Chronic degenerative
changes.

## 2017-06-26 IMAGING — DX DG CHEST 2V
3 series · 3 of 3 positions shown · non-contrast
Comparison: Chest radiograph dated 10/23/2016

CLINICAL DATA: [AGE] male with fall and trauma to the back.

EXAM:
CHEST  2 VIEW

[chest pa]
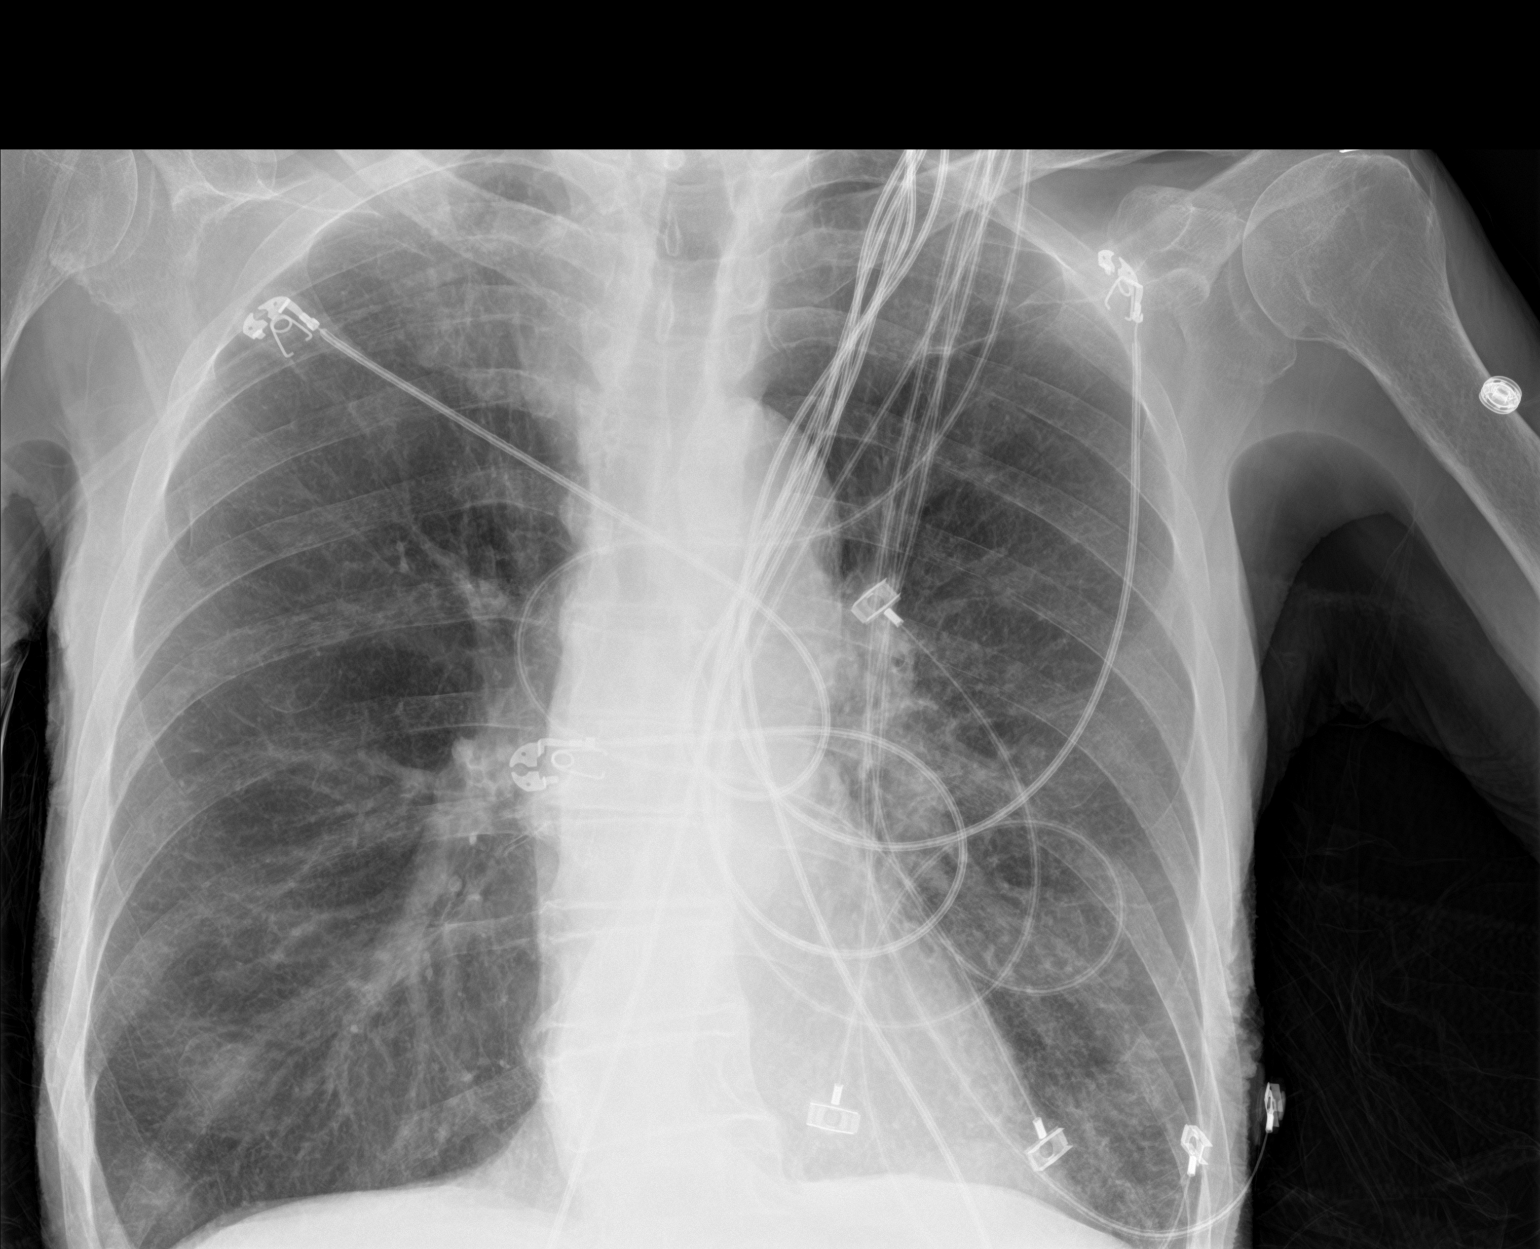

[chest lat (1 of 2)]
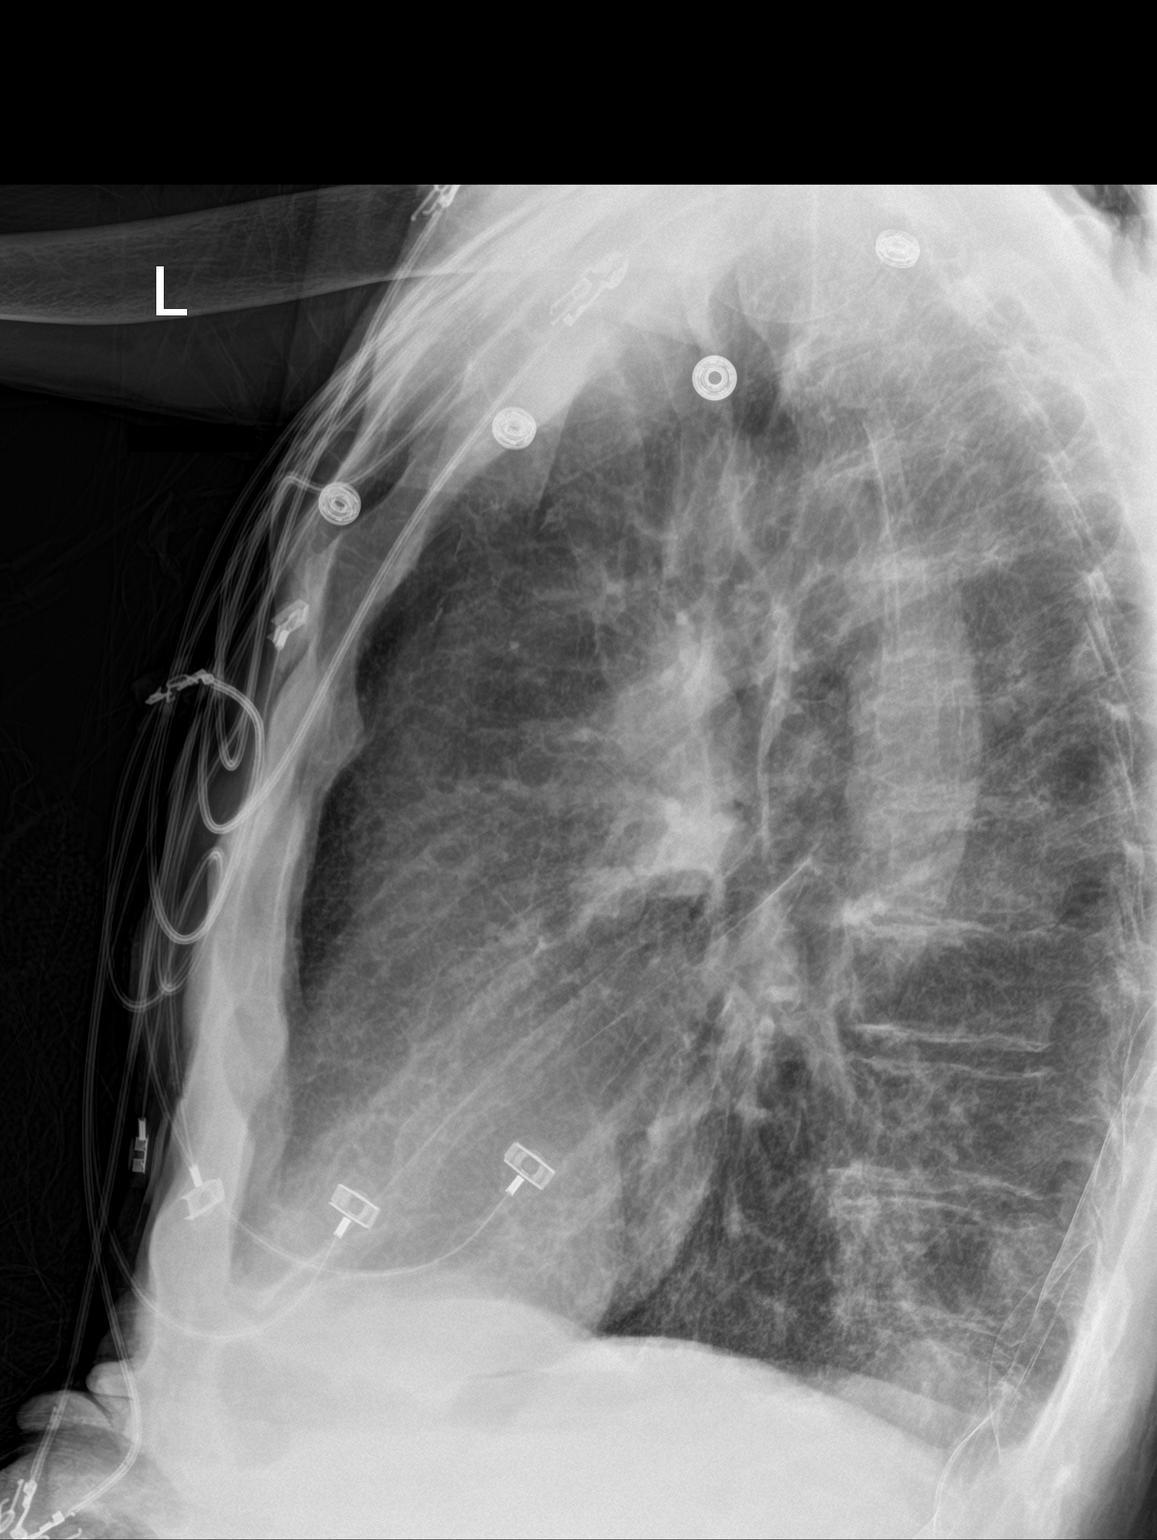

[chest lat (2 of 2)]
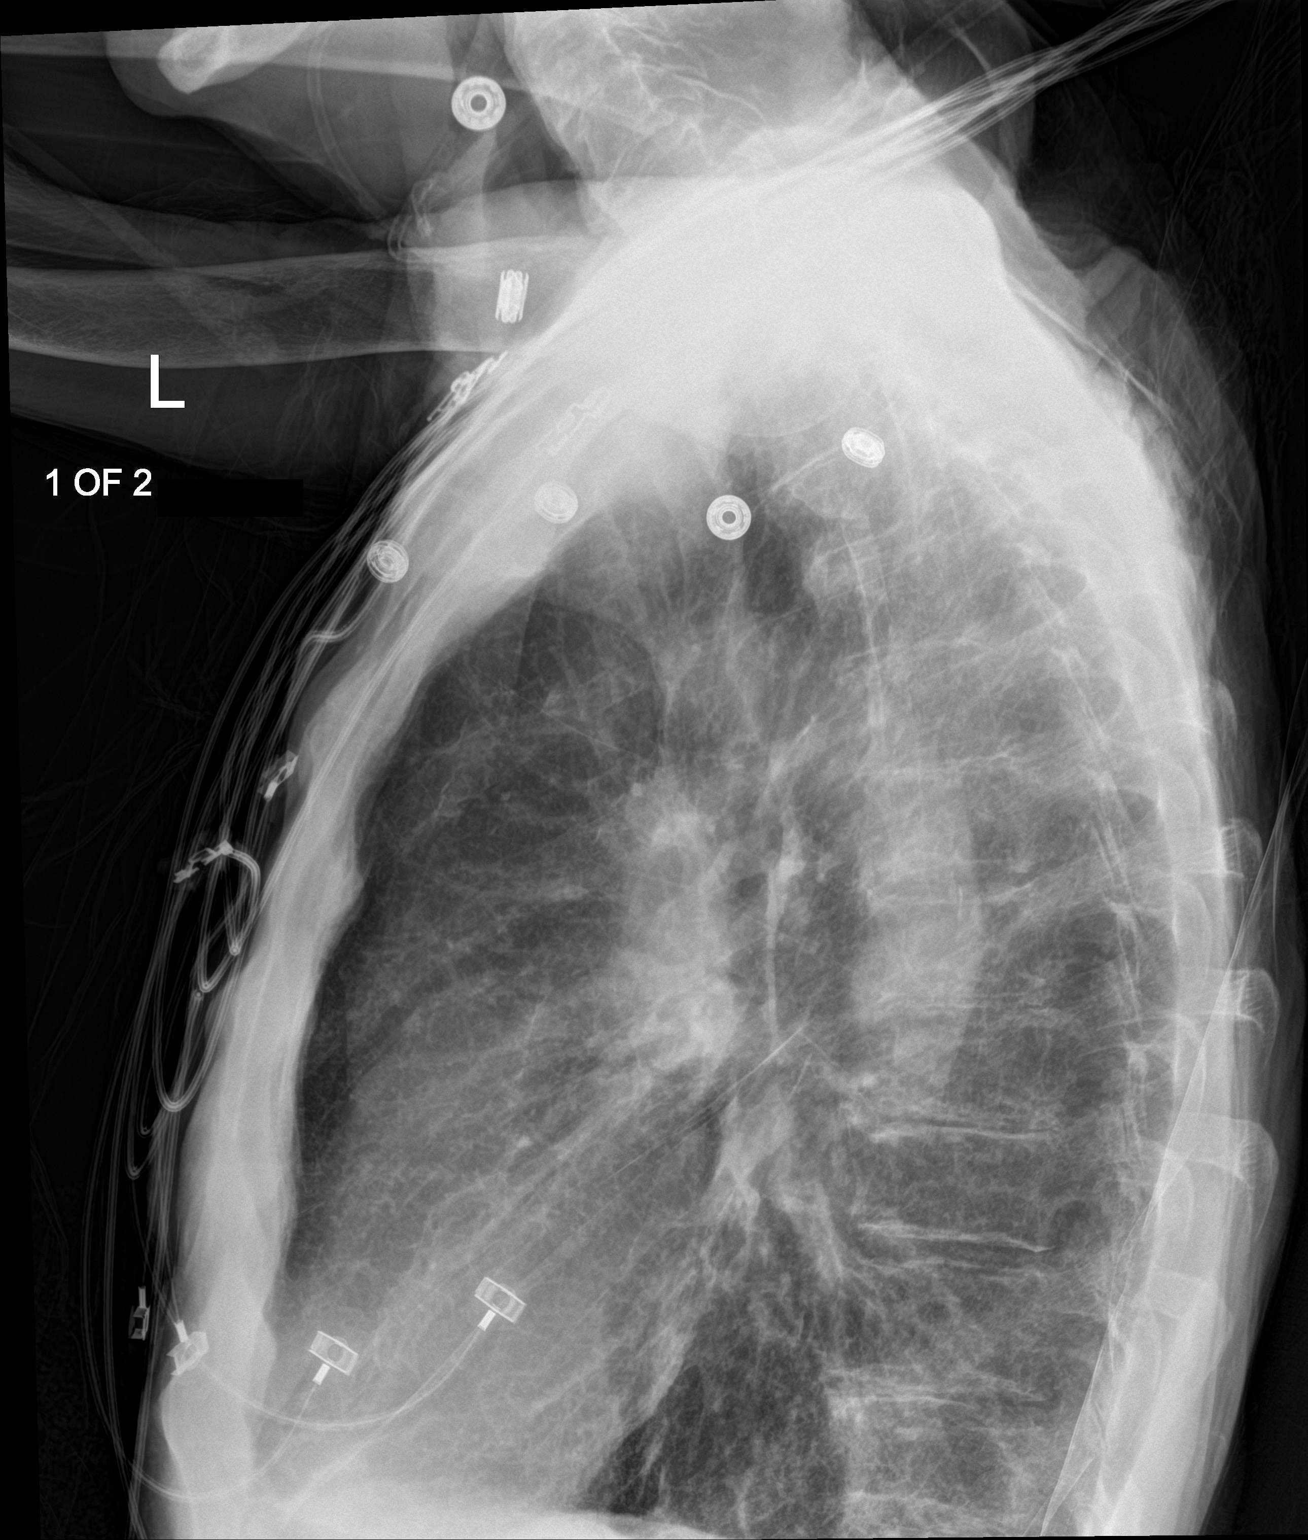

[3 of 3 positions shown; findings below may reference images not displayed]

FINDINGS: There is emphysematous changes of the lungs with hyperinflation and
flattening of the diaphragms. Focal area of mild hazy density at the
posterior costophrenic angles seen on the lateral projection may
represent an area of atelectatic changes. Pneumonia is less likely.
Clinical correlation is recommended. There is no pleural effusion or
pneumothorax. The cardiac silhouette is within normal limits. The
aorta is tortuous. There is osteopenia with degenerative changes of
the spine. No acute fracture.
IMPRESSION: 1. No acute cardiopulmonary process.
2. COPD changes.

## 2017-07-16 IMAGING — DX DG CHEST 1V PORT
2 series · 2 of 2 positions shown · non-contrast
Comparison: 11/27/2016

CLINICAL DATA: Cough and dizziness. Productive cough with clear
sputum.

EXAM:
PORTABLE CHEST 1 VIEW

[chest ap (1 of 2)]
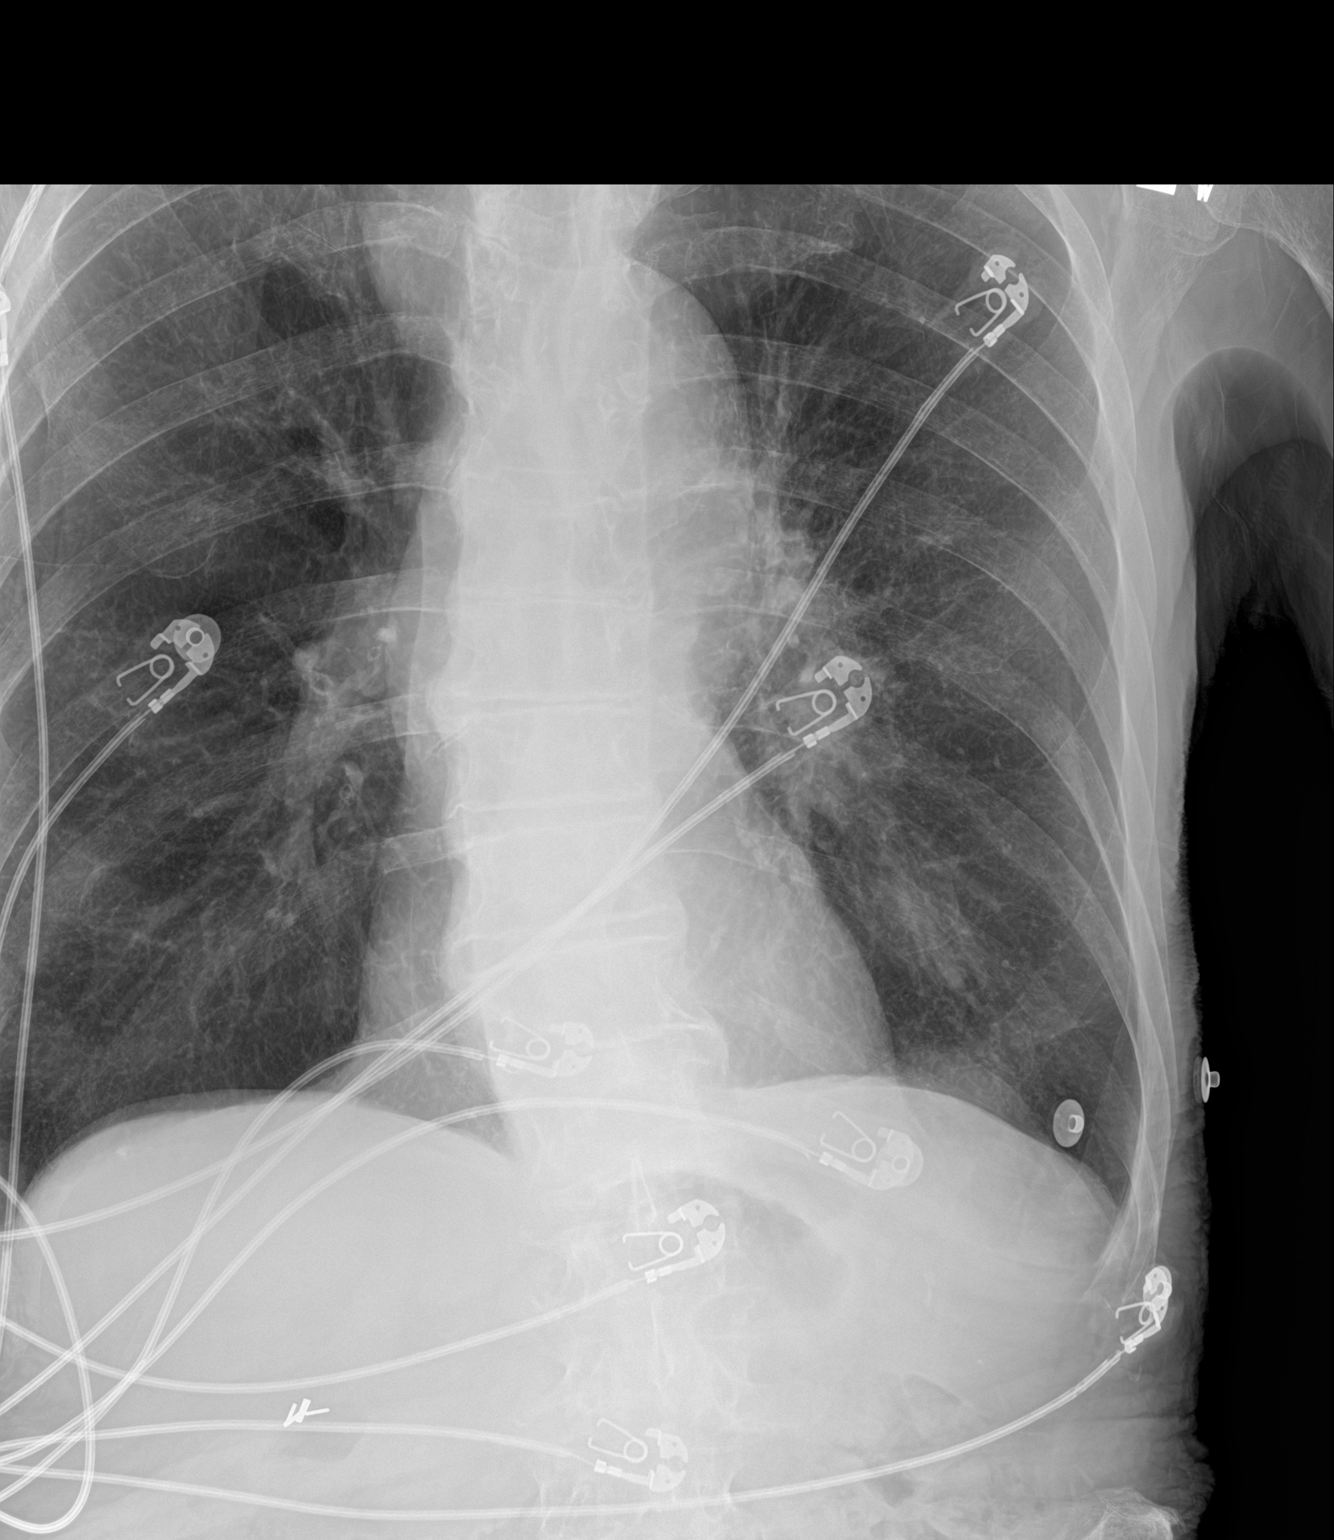

[chest ap (2 of 2)]
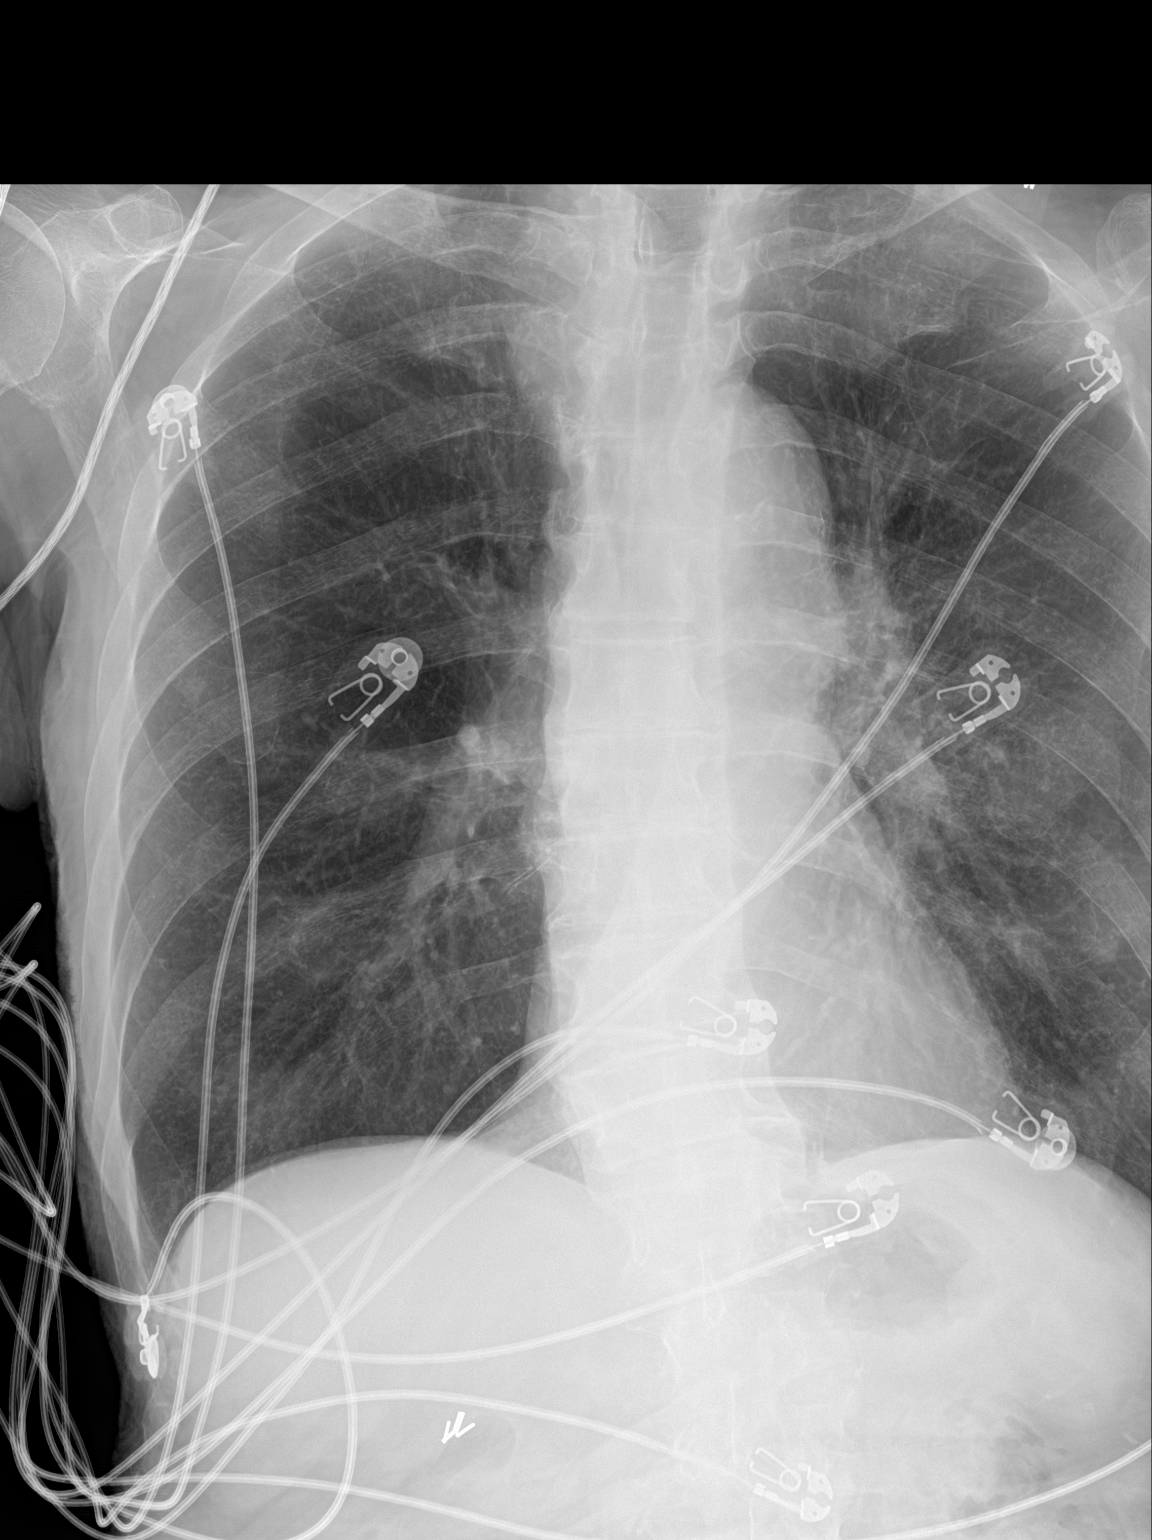

[2 of 2 positions shown; findings below may reference images not displayed]

FINDINGS: Stable hyperinflation. Normal heart size with stable tortuosity and
atherosclerosis of the thoracic aorta. No consolidation, pulmonary
edema, pleural effusion or pneumothorax. Stable osseous structures.
IMPRESSION: 1. No acute abnormality.
2. Stable hyperinflation suggesting COPD.
3. Atherosclerosis of the thoracic aorta.
